# Patient Record
Sex: Female | Born: 1960 | Race: White | Hispanic: No | Marital: Married | Smoking: Never smoker
Health system: Southern US, Community
[De-identification: ages and names within clinical notes are randomized; demographics above are authoritative.]

## PROBLEM LIST (undated history)

## (undated) DIAGNOSIS — R197 Diarrhea, unspecified: Secondary | ICD-10-CM

## (undated) DIAGNOSIS — N809 Endometriosis, unspecified: Secondary | ICD-10-CM

## (undated) DIAGNOSIS — R067 Sneezing: Secondary | ICD-10-CM

## (undated) DIAGNOSIS — D649 Anemia, unspecified: Secondary | ICD-10-CM

## (undated) DIAGNOSIS — Z6838 Body mass index (BMI) 38.0-38.9, adult: Secondary | ICD-10-CM

## (undated) DIAGNOSIS — C801 Malignant (primary) neoplasm, unspecified: Secondary | ICD-10-CM

## (undated) DIAGNOSIS — E559 Vitamin D deficiency, unspecified: Secondary | ICD-10-CM

## (undated) DIAGNOSIS — R0789 Other chest pain: Secondary | ICD-10-CM

## (undated) DIAGNOSIS — T7840XA Allergy, unspecified, initial encounter: Secondary | ICD-10-CM

## (undated) DIAGNOSIS — R4182 Altered mental status, unspecified: Secondary | ICD-10-CM

## (undated) DIAGNOSIS — J45909 Unspecified asthma, uncomplicated: Secondary | ICD-10-CM

## (undated) DIAGNOSIS — E119 Type 2 diabetes mellitus without complications: Secondary | ICD-10-CM

## (undated) DIAGNOSIS — F32A Depression, unspecified: Secondary | ICD-10-CM

## (undated) DIAGNOSIS — K219 Gastro-esophageal reflux disease without esophagitis: Secondary | ICD-10-CM

## (undated) DIAGNOSIS — R002 Palpitations: Secondary | ICD-10-CM

## (undated) DIAGNOSIS — F419 Anxiety disorder, unspecified: Secondary | ICD-10-CM

## (undated) DIAGNOSIS — G4730 Sleep apnea, unspecified: Secondary | ICD-10-CM

## (undated) HISTORY — DX: Sleep apnea, unspecified: G47.30

## (undated) HISTORY — DX: Gastro-esophageal reflux disease without esophagitis: K21.9

## (undated) HISTORY — DX: Vitamin D deficiency, unspecified: E55.9

## (undated) HISTORY — DX: Allergy, unspecified, initial encounter: T78.40XA

## (undated) HISTORY — DX: Malignant (primary) neoplasm, unspecified: C80.1

## (undated) HISTORY — DX: Unspecified asthma, uncomplicated: J45.909

## (undated) HISTORY — DX: Anemia, unspecified: D64.9

## (undated) HISTORY — DX: Sneezing: R06.7

## (undated) HISTORY — DX: Palpitations: R00.2

## (undated) HISTORY — DX: Diarrhea, unspecified: R19.7

## (undated) HISTORY — DX: Other chest pain: R07.89

## (undated) HISTORY — DX: Altered mental status, unspecified: R41.82

## (undated) HISTORY — DX: Type 2 diabetes mellitus without complications: E11.9

## (undated) HISTORY — DX: Body mass index (BMI) 38.0-38.9, adult: Z68.38

## (undated) HISTORY — PX: ABDOMINAL HYSTERECTOMY: SHX81

## (undated) HISTORY — PX: ABDOMINAL SURGERY: SHX537

## (undated) HISTORY — DX: Anxiety disorder, unspecified: F41.9

## (undated) HISTORY — DX: Depression, unspecified: F32.A

---

## 2013-05-06 ENCOUNTER — Emergency Department (HOSPITAL_COMMUNITY): Payer: BC Managed Care – PPO

## 2013-05-06 ENCOUNTER — Emergency Department (HOSPITAL_COMMUNITY)
Admission: EM | Admit: 2013-05-06 | Discharge: 2013-05-06 | Disposition: A | Discharge status: Home / Self Care | Payer: BC Managed Care – PPO | Attending: Emergency Medicine | Admitting: Emergency Medicine

## 2013-05-06 ENCOUNTER — Encounter (HOSPITAL_COMMUNITY): Payer: Self-pay | Admitting: *Deleted

## 2013-05-06 DIAGNOSIS — Z8742 Personal history of other diseases of the female genital tract: Secondary | ICD-10-CM | POA: Insufficient documentation

## 2013-05-06 DIAGNOSIS — R1031 Right lower quadrant pain: Secondary | ICD-10-CM | POA: Insufficient documentation

## 2013-05-06 DIAGNOSIS — Z9889 Other specified postprocedural states: Secondary | ICD-10-CM | POA: Insufficient documentation

## 2013-05-06 DIAGNOSIS — Z79899 Other long term (current) drug therapy: Secondary | ICD-10-CM | POA: Insufficient documentation

## 2013-05-06 DIAGNOSIS — K5900 Constipation, unspecified: Secondary | ICD-10-CM | POA: Insufficient documentation

## 2013-05-06 DIAGNOSIS — R109 Unspecified abdominal pain: Secondary | ICD-10-CM

## 2013-05-06 HISTORY — DX: Endometriosis, unspecified: N80.9

## 2013-05-06 LAB — COMPREHENSIVE METABOLIC PANEL
ALT: 9 U/L (ref 0–35)
AST: 9 U/L (ref 0–37)
Albumin: 3.6 g/dL (ref 3.5–5.2)
Alkaline Phosphatase: 50 U/L (ref 39–117)
BUN: 7 mg/dL (ref 6–23)
CO2: 28 mEq/L (ref 19–32)
Calcium: 9.1 mg/dL (ref 8.4–10.5)
Chloride: 102 mEq/L (ref 96–112)
Creatinine, Ser: 0.67 mg/dL (ref 0.50–1.10)
GFR calc Af Amer: >90 mL/min (ref >90)
GFR calc non Af Amer: >90 mL/min (ref >90)
Glucose, Bld: 102 mg/dL — ABNORMAL HIGH (ref 70–99)
Potassium: 4.2 mEq/L (ref 3.5–5.1)
Sodium: 137 mEq/L (ref 135–145)
Total Bilirubin: 0.3 mg/dL (ref 0.3–1.2)
Total Protein: 6.6 g/dL (ref 6.0–8.3)

## 2013-05-06 LAB — URINALYSIS, ROUTINE W REFLEX MICROSCOPIC
Bilirubin Urine: NEGATIVE
Glucose, UA: NEGATIVE mg/dL
Hgb urine dipstick: NEGATIVE
Ketones, ur: NEGATIVE mg/dL
Leukocytes, UA: NEGATIVE
Nitrite: NEGATIVE
Protein, ur: NEGATIVE mg/dL
Specific Gravity, Urine: 1.01 (ref 1.005–1.030)
Urobilinogen, UA: 0.2 mg/dL (ref 0.0–1.0)
pH: 7 (ref 5.0–8.0)

## 2013-05-06 LAB — CBC
HCT: 39.1 % (ref 36.0–46.0)
Hemoglobin: 13.2 g/dL (ref 12.0–15.0)
MCH: 29.1 pg (ref 26.0–34.0)
MCHC: 33.8 g/dL (ref 30.0–36.0)
MCV: 86.3 fL (ref 78.0–100.0)
Platelets: 195 10*3/uL (ref 150–400)
RBC: 4.53 MIL/uL (ref 3.87–5.11)
RDW: 14.4 % (ref 11.5–15.5)
WBC: 5.4 10*3/uL (ref 4.0–10.5)

## 2013-05-06 MED ORDER — SODIUM CHLORIDE 0.9 % IV BOLUS (SEPSIS)
2000.0000 mL | Freq: Once | INTRAVENOUS | Status: AC
  Administered 2013-05-06: 2000 mL via INTRAVENOUS

## 2013-05-06 MED ORDER — IOHEXOL 300 MG/ML  SOLN
100.0000 mL | Freq: Once | INTRAMUSCULAR | Status: AC | PRN
  Administered 2013-05-06: 100 mL via INTRAVENOUS

## 2013-05-06 MED ORDER — IOHEXOL 300 MG/ML  SOLN
50.0000 mL | Freq: Once | INTRAMUSCULAR | Status: AC | PRN
  Administered 2013-05-06: 50 mL via ORAL

## 2013-05-06 NOTE — Progress Notes (Signed)
[facility] ED CM noted not pcp for pt  She reports her pcp is in [place] [address] Maida Sale at [phone] and also sees Roxanne Hollander at [phone]  EPIC updated

## 2013-05-06 NOTE — ED Notes (Signed)
Patient transported to CT 

## 2013-05-06 NOTE — ED Notes (Signed)
Patient transported to X-ray 

## 2013-05-06 NOTE — ED Notes (Signed)
Pt from home with reports of RLQ pain and decrease in size and amount of stool over the past 3 weeks to spite using enemas and stool softeners. Pt endorses hx of endometriosis that has wrapped around her intestine. Pt denies N/V and reports that she has been eating and drinking normally. Pt reports having US done 2 days ago due to pain and was told that if pain continues she needed to come to ED for further eval. Thayer Ohm, PA at bedside at 1154.

## 2013-05-10 NOTE — ED Provider Notes (Signed)
History     CSN: [id number]  Arrival date & time 05/06/13  1125   First MD Initiated Contact with Patient 05/06/13 1144      Chief Complaint  Patient presents with  . Constipation  . Abdominal Pain    RLQ    (Consider location/radiation/quality/duration/timing/severity/associated sxs/prior treatment) HPI The patient presents to the emergency department with constipation for the last week.  Patient, states she's had decreased bowel movement.  Patient, states she's also had decreased bowel movements over the last several years.  Patient denies chest pain, shortness breath, nausea, vomiting, diarrhea, weakness, dizziness, headache, blurred vision, fever, back pain, dysuria, bloody stool, vaginal bleeding, or syncope.  Patient, states, that she's tried over-the-counter holistic treatment for bowel movements.  Patient, states she feels, like there is decreased movement of her stool. Past Medical History  Diagnosis Date  . Endometriosis     Past Surgical History  Procedure Laterality Date  . Cesarean section    . Abdominal surgery      laporoscopic    History reviewed. No pertinent family history.  History  Substance Use Topics  . Smoking status: Never Smoker   . Smokeless tobacco: Never Used  . Alcohol Use: Yes     Comment: 5-7 times a month    OB History   Grav Para Term Preterm Abortions TAB SAB Ect Mult Living                  Review of Systems All other systems negative except as documented in the HPI. All pertinent positives and negatives as reviewed in the HPI.  Allergies  Codeine; Food; Gluten meal; Lactose intolerance (gi); Percocet; and Sudafed  Home Medications   Current Outpatient Rx  Name  Route  Sig  Dispense  Refill  . albuterol (PROVENTIL HFA;VENTOLIN HFA) 108 (90 BASE) MCG/ACT inhaler   Inhalation   Inhale 2 puffs into the lungs every 6 (six) hours as needed (for asthma).         . Cobalamine Combinations (METHYL-MAX PO)   Oral   Take 1  tablet by mouth daily.         . Coenzyme Q10 (COQ10 PO)   Oral   Take 1 capsule by mouth daily.         Marland Kitchen EPINEPHrine (EPIPEN) 0.3 mg/0.3 mL DEVI   Intramuscular   Inject 0.3 mg into the muscle as needed (for allergic reaction).         . fish oil-omega-3 fatty acids 1000 MG capsule   Oral   Take 1 g by mouth daily.         Marland Kitchen GLYCINE PO   Oral   Take 1 tablet by mouth daily.         . Lactobacillus (ACIDOPHILUS PO)   Oral   Take 1 capsule by mouth daily.         Marland Kitchen MAGNESIUM PO   Oral   Take 1 tablet by mouth daily. Magnesium taurate         . Misc Natural Products (COLON HERBAL CLEANSER PO)   Oral   Take 1 capsule by mouth as needed (for bowel movement).         Marland Kitchen OVER THE COUNTER MEDICATION   Oral   Take 1 capsule by mouth daily. Catazyme.         Marland Kitchen OVER THE COUNTER MEDICATION      Supplement Iron Plant food  BP 129/63  Pulse 66  Temp(Src) 98.4 F (36.9 C) (Oral)  Resp 18  SpO2 100%  LMP 04/22/2013  Physical Exam  Nursing note and vitals reviewed. Constitutional: She is oriented to person, place, and time. She appears well-developed and well-nourished. No distress.  HENT:  Head: Normocephalic and atraumatic.  Mouth/Throat: Oropharynx is clear and moist.  Eyes: Pupils are equal, round, and reactive to light.  Neck: Normal range of motion. Neck supple.  Cardiovascular: Normal rate, regular rhythm and normal heart sounds.  Exam reveals no gallop and no friction rub.   No murmur heard. Pulmonary/Chest: Effort normal and breath sounds normal. No respiratory distress.  Abdominal: Soft. Bowel sounds are normal. She exhibits no distension. There is tenderness. There is no rebound and no guarding.  Neurological: She is alert and oriented to person, place, and time. She exhibits normal muscle tone. Coordination normal.  Skin: Skin is warm and dry.    ED Course  Procedures (including critical care time)  Labs Reviewed   COMPREHENSIVE METABOLIC PANEL - Abnormal; Notable for the following:    Glucose, Bld 102 (*)    All other components within normal limits  CBC  URINALYSIS, ROUTINE W REFLEX MICROSCOPIC   Patient is given GI.  Followup.  All lab tests and results were discussed with the patient at great length.  All questions were answered with the, the patient and her husband.  Patient is advised return to the emergency department for any worsening in her condition, that this could be an evolving process that is yet to declare itself.  1. Abdominal discomfort       MDM  MDM Reviewed: vitals and nursing note Interpretation: labs, CT scan and x-ray           Carlyle Dolly, PA-C 05/10/13 1850

## 2013-05-15 NOTE — ED Provider Notes (Signed)
Medical screening examination/treatment/procedure(s) were performed by non-physician practitioner and as supervising physician I was immediately available for consultation/collaboration.   Suzi Roots, MD 05/15/13 1101

## 2013-08-29 DIAGNOSIS — J45909 Unspecified asthma, uncomplicated: Secondary | ICD-10-CM | POA: Insufficient documentation

## 2020-07-25 DIAGNOSIS — R197 Diarrhea, unspecified: Secondary | ICD-10-CM | POA: Insufficient documentation

## 2020-07-25 DIAGNOSIS — E559 Vitamin D deficiency, unspecified: Secondary | ICD-10-CM | POA: Insufficient documentation

## 2020-07-25 DIAGNOSIS — N809 Endometriosis, unspecified: Secondary | ICD-10-CM | POA: Insufficient documentation

## 2020-07-25 DIAGNOSIS — R0789 Other chest pain: Secondary | ICD-10-CM | POA: Insufficient documentation

## 2020-07-25 DIAGNOSIS — Z6838 Body mass index (BMI) 38.0-38.9, adult: Secondary | ICD-10-CM | POA: Insufficient documentation

## 2020-07-25 DIAGNOSIS — R002 Palpitations: Secondary | ICD-10-CM | POA: Insufficient documentation

## 2020-07-25 DIAGNOSIS — R067 Sneezing: Secondary | ICD-10-CM | POA: Insufficient documentation

## 2020-07-25 DIAGNOSIS — R4182 Altered mental status, unspecified: Secondary | ICD-10-CM | POA: Insufficient documentation

## 2020-07-26 ENCOUNTER — Other Ambulatory Visit: Payer: Self-pay

## 2020-07-26 ENCOUNTER — Ambulatory Visit: Payer: BC Managed Care – PPO | Admitting: Cardiology

## 2020-07-26 ENCOUNTER — Encounter: Payer: Self-pay | Admitting: Cardiology

## 2020-07-26 ENCOUNTER — Ambulatory Visit (INDEPENDENT_AMBULATORY_CARE_PROVIDER_SITE_OTHER): Payer: BC Managed Care – PPO

## 2020-07-26 VITALS — BP 148/98 | HR 68 | Ht 66.5 in | Wt 228.8 lb

## 2020-07-26 DIAGNOSIS — R002 Palpitations: Secondary | ICD-10-CM

## 2020-07-26 DIAGNOSIS — R030 Elevated blood-pressure reading, without diagnosis of hypertension: Secondary | ICD-10-CM

## 2020-07-26 DIAGNOSIS — R0602 Shortness of breath: Secondary | ICD-10-CM

## 2020-07-26 DIAGNOSIS — R072 Precordial pain: Secondary | ICD-10-CM

## 2020-07-26 DIAGNOSIS — E669 Obesity, unspecified: Secondary | ICD-10-CM

## 2020-07-26 DIAGNOSIS — R0789 Other chest pain: Secondary | ICD-10-CM

## 2020-07-26 MED ORDER — NITROGLYCERIN 0.4 MG SL SUBL: 0.4000 mg | SUBLINGUAL_TABLET | SUBLINGUAL | 3 refills | Status: DC | PRN

## 2020-07-26 MED ORDER — METOPROLOL TARTRATE 100 MG PO TABS: 100.0000 mg | ORAL_TABLET | ORAL | 0 refills | Status: DC

## 2020-07-26 NOTE — Progress Notes (Signed)
Cardiology Office Note:    Date:  07/26/2020   ID:  Kelly Good, DOB 1961/05/27, MRN [id number]  PCP:  System, Provider Not In  Cardiologist:  Berniece Salines, DO  Electrophysiologist:  None   Referring MD: Chriss Czar, MD   " I experiencing some chest tightness"  History of Present Illness:    Kelly Good is a 59 y.o. female with a hx of gestational diabetes, has had a full-term delivery of 2 of her kids, uterine cancer with full hysterectomy, obesity, patient was adopted therefore does not know other medical history.  The patient comes today to tell me that she has been experiencing intermittent chest tightness.  She notes that it was going on for a while when she had intermittent chest tightness but recently she has been having midsternal chest tightness that now radiates to her shoulder and down her arm.  At times she does have some shortness of breath.  This does improve with rest.  In addition she is concerned with the fact that she is also is having abrupt intermittent fast heartbeat which last for few minutes and then resolved.  The episodes are getting more more she therefore decided to see her PCP who recommended she see cardiology.    Past Medical History:  Diagnosis Date  . Altered mental status, unspecified   . BMI 38.0-38.9,adult   . Diarrhea, unspecified   . Endometriosis   . Other chest pain   . Palpitations   . Sneezing   . Vitamin D deficiency, unspecified     Past Surgical History:  Procedure Laterality Date  . ABDOMINAL SURGERY     laporoscopic  . CESAREAN SECTION      Current Medications: Current Meds  Medication Sig  . albuterol (PROVENTIL HFA;VENTOLIN HFA) 108 (90 BASE) MCG/ACT inhaler Inhale 2 puffs into the lungs every 6 (six) hours as needed (for asthma).  . EPINEPHrine (EPIPEN) 0.3 mg/0.3 mL DEVI Inject 0.3 mg into the muscle as needed (for allergic reaction).  . Lactobacillus (ACIDOPHILUS PO) Take 1 capsule by mouth daily.  Marland Kitchen MAGNESIUM PO  Take 1 tablet by mouth daily. Magnesium taurate  . Misc Natural Products (COLON HERBAL CLEANSER PO) Take 1 capsule by mouth as needed (for bowel movement).  Marland Kitchen OVER THE COUNTER MEDICATION Take 1 capsule by mouth daily. Catazyme.     Allergies:   Codeine, Doxycycline, Pseudoephedrine hcl, Latex, Other, Tomato, Food, Lactose intolerance (gi), Percocet [oxycodone-acetaminophen], Casein, Gluten meal, Lactase, and Sudafed [pseudoephedrine hcl]   Social History   Socioeconomic History  . Marital status: Married    Spouse name: Not on file  . Number of children: Not on file  . Years of education: Not on file  . Highest education level: Not on file  Occupational History  . Not on file  Tobacco Use  . Smoking status: Never Smoker  . Smokeless tobacco: Never Used  Substance and Sexual Activity  . Alcohol use: Yes    Comment: 5-7 times a month  . Drug use: No  . Sexual activity: Not on file  Other Topics Concern  . Not on file  Social History Narrative  . Not on file   Social Determinants of Health   Financial Resource Strain:   . Difficulty of Paying Living Expenses: Not on file  Food Insecurity:   . Worried About Charity fundraiser in the Last Year: Not on file  . Ran Out of Food in the Last Year: Not on file  Transportation Needs:   .  Lack of Transportation (Medical): Not on file  . Lack of Transportation (Non-Medical): Not on file  Physical Activity:   . Days of Exercise per Week: Not on file  . Minutes of Exercise per Session: Not on file  Stress:   . Feeling of Stress : Not on file  Social Connections:   . Frequency of Communication with Friends and Family: Not on file  . Frequency of Social Gatherings with Friends and Family: Not on file  . Attends Religious Services: Not on file  . Active Member of Clubs or Organizations: Not on file  . Attends Banker Meetings: Not on file  . Marital Status: Not on file     Family History: The patient's family history  is not on file. She was adopted.  ROS:   Review of Systems  Constitution: Negative for decreased appetite, fever and weight gain.  HENT: Negative for congestion, ear discharge, hoarse voice and sore throat.   Eyes: Negative for discharge, redness, vision loss in right eye and visual halos.  Cardiovascular: Reports chest pain, dyspnea on exertion and palpitations.  Negative for leg swelling, orthopnea.  Respiratory: Negative for cough, hemoptysis, shortness of breath and snoring.   Endocrine: Negative for heat intolerance and polyphagia.  Hematologic/Lymphatic: Negative for bleeding problem. Does not bruise/bleed easily.  Skin: Negative for flushing, nail changes, rash and suspicious lesions.  Musculoskeletal: Negative for arthritis, joint pain, muscle cramps, myalgias, neck pain and stiffness.  Gastrointestinal: Negative for abdominal pain, bowel incontinence, diarrhea and excessive appetite.  Genitourinary: Negative for decreased libido, genital sores and incomplete emptying.  Neurological: Negative for brief paralysis, focal weakness, headaches and loss of balance.  Psychiatric/Behavioral: Negative for altered mental status, depression and suicidal ideas.  Allergic/Immunologic: Negative for HIV exposure and persistent infections.    EKGs/Labs/Other Studies Reviewed:    The following studies were reviewed today:   EKG:  The ekg ordered today demonstrates sinus rhythm, heart rate 60 bpm nonspecific ST changes.  Recent Labs: WBC 5.6, hemoglobin 14.0, hematocrit 40.5, platelets 228 Alk phos 64, AST 8, ALT 11 TSH 1.61  Recent Lipid Panel No results found for: CHOL, TRIG, HDL, CHOLHDL, VLDL, LDLCALC, LDLDIRECT  Total cholesterol 172, triglyceride 63, HDL 69, LDL 91  Physical Exam:    VS:  BP (!) 148/98   Pulse 68   Ht 5' 6.5" (1.689 m)   Wt 228 lb 12.8 oz (103.8 kg)   SpO2 95%   BMI 36.38 kg/m     Wt Readings from Last 3 Encounters:  07/26/20 228 lb 12.8 oz (103.8 kg)      GEN: Well nourished, well developed in no acute distress HEENT: Normal NECK: No JVD; No carotid bruits LYMPHATICS: No lymphadenopathy CARDIAC: S1S2 noted,RRR, no murmurs, rubs, gallops RESPIRATORY:  Clear to auscultation without rales, wheezing or rhonchi  ABDOMEN: Soft, non-tender, non-distended, +bowel sounds, no guarding. EXTREMITIES: No edema, No cyanosis, no clubbing MUSCULOSKELETAL:  No deformity  SKIN: Warm and dry NEUROLOGIC:  Alert and oriented x 3, non-focal PSYCHIATRIC:  Normal affect, good insight  ASSESSMENT:    1. Palpitations   2. Precordial pain   3. Elevated blood pressure reading   4. Shortness of breath   5. Obesity (BMI 30-39.9)   6. Other chest pain    PLAN:    I would like to rule out a cardiovascular etiology of this palpitation, therefore at this time I would like to placed a zio patch for 7 days. In additon a transthoracic echocardiogram will  be ordered to assess LV/RV function and any structural abnormalities. Once these testing have been performed amd reviewed further reccomendations will be made. For now, I do reccomend that the patient goes to the nearest ED if  symptoms recur.  In addition her chest pain is concerning unfortunately outside of gestational diabetes and obesity we have no other known risk factors for family history given the patient was adopted.  Therefore like to proceed with ischemic evaluation.  A coronary CTA would be appropriate in this patient.  She has no IV contrast dye allergy she is agreeable to proceed with testing.  Sublingual nitroglycerin prescription was sent, its protocol and 911 protocol explained and the patient vocalized understanding questions were answered to the patient's satisfaction  Her blood pressure manually is elevated in the office such discussed with the patient she tells me that she does not have a history of hypertension at this advised her that she may be developing hypertension.  She notes that she  discussed with her PCP prior and she has been taking her blood pressure in the last couple which she tells me that she is taking it at times she gets a systolic of 737 time to get a systolic of 106.  I have asked the patient to take her blood pressure steady twice daily for also be able to review this and make good recommendations.  The patient understands the need to lose weight with diet and exercise. We have discussed specific strategies for this.  The patient is in agreement with the above plan. The patient left the office in stable condition.  The patient will follow up in 3 months or sooner if needed.   Medication Adjustments/Labs and Tests Ordered: Current medicines are reviewed at length with the patient today.  Concerns regarding medicines are outlined above.  Orders Placed This Encounter  Procedures  . CT CORONARY MORPH W/CTA COR W/SCORE W/CA W/CM &/OR WO/CM  . CT CORONARY FRACTIONAL FLOW RESERVE DATA PREP  . CT CORONARY FRACTIONAL FLOW RESERVE FLUID ANALYSIS  . Basic metabolic panel  . LONG TERM MONITOR (3-14 DAYS)  . EKG 12-Lead  . ECHOCARDIOGRAM COMPLETE   Meds ordered this encounter  Medications  . nitroGLYCERIN (NITROSTAT) 0.4 MG SL tablet    Sig: Place 1 tablet (0.4 mg total) under the tongue every 5 (five) minutes as needed for chest pain.    Dispense:  25 tablet    Refill:  3  . metoprolol tartrate (LOPRESSOR) 100 MG tablet    Sig: Take 1 tablet (100 mg total) by mouth as directed. Take 2 hours prior to CT    Dispense:  1 tablet    Refill:  0    Patient Instructions  Medication Instructions:  Start Nitroglycerin 0.4 mg every 5 minutes as needed for chest pain   *If you need a refill on your cardiac medications before your next appointment, please call your pharmacy*   Lab Work: Your physician recommends that you return for lab work in 1 week before CT Lab is open Monday-Friday 8 am- 5 pm, No appointment needed  If you have labs (blood work) drawn today and  your tests are completely normal, you will receive your results only by: Marland Kitchen MyChart Message (if you have MyChart) OR . A paper copy in the mail If you have any lab test that is abnormal or we need to change your treatment, we will call you to review the results.   Testing/Procedures: A zio monitor was ordered today.  It will remain on for 7 days. You will then return monitor and event diary in provided box. It takes 1-2 weeks for report to be downloaded and returned to [place]. We will call you with the results. If monitor falls off or has orange flashing light, please call Zio for further instructions.   Your physician has requested that you have an echocardiogram. Echocardiography is a painless test that uses sound waves to create images of your heart. It provides your doctor with information about the size and shape of your heart and how well your heart's chambers and valves are working. This procedure takes approximately one hour. There are no restrictions for this procedure.  Your physician has ordered for you to have a cardiac Ct *Instructions below*     Follow-Up: At [facility], you and your health needs are our priority.  As part of our continuing mission to provide you with exceptional heart care, we have created designated Provider Care Teams.  These Care Teams include your primary Cardiologist (physician) and Advanced Practice Providers (APPs -  Physician Assistants and Nurse Practitioners) who all work together to provide you with the care you need, when you need it.  We recommend signing up for the patient portal called "MyChart".  Sign up information is provided on this After Visit Summary.  MyChart is used to connect with patients for Virtual Visits (Telemedicine).  Patients are able to view lab/test results, encounter notes, upcoming appointments, etc.  Non-urgent messages can be sent to your provider as well.   To learn more about what you can do with MyChart, go to  [email or web address].    Your next appointment:   3 month(s)  The format for your next appointment:   In Person  Provider:   Berniece Salines, DO   Other Instructions Your cardiac CT will be scheduled at one of the below locations:   [facility] [address] [place], [place] [address] [phone]   If scheduled at [facility], please arrive at the [facility] main entrance of [facility] 30 minutes prior to test start time. Proceed to the [facility] Radiology Department (first floor) to check-in and test prep.  Please follow these instructions carefully (unless otherwise directed):  On the Night Before the Test: . Be sure to Drink plenty of water. . Do not consume any caffeinated/decaffeinated beverages or chocolate 12 hours prior to your test. . Do not take any antihistamines 12 hours prior to your test.  On the Day of the Test: . Drink plenty of water. Do not drink any water within one hour of the test. . Do not eat any food 4 hours prior to the test. . You may take your regular medications prior to the test.  . Take metoprolol (Lopressor) two hours prior to test. . FEMALES- please wear underwire-free bra if available        After the Test: . Drink plenty of water. . After receiving IV contrast, you may experience a mild flushed feeling. This is normal. . On occasion, you may experience a mild rash up to 24 hours after the test. This is not dangerous. If this occurs, you can take Benadryl 25 mg and increase your fluid intake. . If you experience trouble breathing, this can be serious. If it is severe call 911 IMMEDIATELY. If it is mild, please call our office. . If you take any of these medications: Glipizide/Metformin, Avandament, Glucavance, please do not take 48 hours after completing  test unless otherwise instructed.   Once we have confirmed authorization from your insurance company, we will call you to set up a date and time for  your test. Based on how quickly your insurance processes prior authorizations requests, please allow up to 4 weeks to be contacted for scheduling your Cardiac CT appointment. Be advised that routine Cardiac CT appointments could be scheduled as many as 8 weeks after your provider has ordered it.  For non-scheduling related questions, please contact the cardiac imaging nurse navigator should you have any questions/concerns: Marchia Bond, Cardiac Imaging Nurse Navigator Burley Saver, Interim Cardiac Imaging Nurse [facility] Direct Office Dial: [phone]   For scheduling needs, including cancellations and rescheduling, please call Vivien Rota at [phone], option 3.        Adopting a Healthy Lifestyle.  Know what a healthy weight is for you (roughly BMI <25) and aim to maintain this   Aim for 7+ servings of fruits and vegetables daily   65-80+ fluid ounces of water or unsweet tea for healthy kidneys   Limit to max 1 drink of alcohol per day; avoid smoking/tobacco   Limit animal fats in diet for cholesterol and heart health - choose grass fed whenever available   Avoid highly processed foods, and foods high in saturated/trans fats   Aim for low stress - take time to unwind and care for your mental health   Aim for 150 min of moderate intensity exercise weekly for heart health, and weights twice weekly for bone health   Aim for 7-9 hours of sleep daily   When it comes to diets, agreement about the perfect plan isnt easy to find, even among the experts. Experts at the [employer] developed an idea known as the Healthy Eating Plate.  Yes when she is yes yes or is body program so explained to me in the office imagine a plate divided into logical, healthy portions.   The emphasis is on diet quality:   Load up on vegetables and fruits - one-half of your plate: Aim for color and variety, and remember that potatoes dont count.   Go  for whole grains - one-quarter of your plate: Whole wheat, barley, wheat berries, quinoa, oats, brown rice, and foods made with them. If you want pasta, go with whole wheat pasta.   Protein power - one-quarter of your plate: Fish, chicken, beans, and nuts are all healthy, versatile protein sources. Limit red meat.   The diet, however, does go beyond the plate, offering a few other suggestions.   Use healthy plant oils, such as olive, canola, soy, corn, sunflower and peanut. Check the labels, and avoid partially hydrogenated oil, which have unhealthy trans fats.   If youre thirsty, drink water. Coffee and tea are good in moderation, but skip sugary drinks and limit milk and dairy products to one or two daily servings.   The type of carbohydrate in the diet is more important than the amount. Some sources of carbohydrates, such as vegetables, fruits, whole grains, and beans-are healthier than others.   Finally, stay active  Signed, Berniece Salines, DO  07/26/2020 12:01 PM    [facility]

## 2020-07-26 NOTE — Patient Instructions (Addendum)
Medication Instructions:  Start Nitroglycerin 0.4 mg every 5 minutes as needed for chest pain   *If you need a refill on your cardiac medications before your next appointment, please call your pharmacy*   Lab Work: Your physician recommends that you return for lab work in 1 week before CT Lab is open Monday-Friday 8 am- 5 pm, No appointment needed  If you have labs (blood work) drawn today and your tests are completely normal, you will receive your results only by: Marland Kitchen MyChart Message (if you have MyChart) OR . A paper copy in the mail If you have any lab test that is abnormal or we need to change your treatment, we will call you to review the results.   Testing/Procedures: A zio monitor was ordered today. It will remain on for 7 days. You will then return monitor and event diary in provided box. It takes 1-2 weeks for report to be downloaded and returned to [place]. We will call you with the results. If monitor falls off or has orange flashing light, please call Zio for further instructions.   Your physician has requested that you have an echocardiogram. Echocardiography is a painless test that uses sound waves to create images of your heart. It provides your doctor with information about the size and shape of your heart and how well your heart's chambers and valves are working. This procedure takes approximately one hour. There are no restrictions for this procedure.  Your physician has ordered for you to have a cardiac Ct *Instructions below*     Follow-Up: At [facility], you and your health needs are our priority.  As part of our continuing mission to provide you with exceptional heart care, we have created designated Provider Care Teams.  These Care Teams include your primary Cardiologist (physician) and Advanced Practice Providers (APPs -  Physician Assistants and Nurse Practitioners) who all work together to provide you with the care you need, when you need it.  We recommend signing  up for the patient portal called "MyChart".  Sign up information is provided on this After Visit Summary.  MyChart is used to connect with patients for Virtual Visits (Telemedicine).  Patients are able to view lab/test results, encounter notes, upcoming appointments, etc.  Non-urgent messages can be sent to your provider as well.   To learn more about what you can do with MyChart, go to [email or web address].    Your next appointment:   3 month(s)  The format for your next appointment:   In Person  Provider:   Berniece Salines, DO   Other Instructions Your cardiac CT will be scheduled at one of the below locations:   [facility] [address] [place], [place] [address] [phone]   If scheduled at [facility], please arrive at the [facility] main entrance of [facility] 30 minutes prior to test start time. Proceed to the [facility] Radiology Department (first floor) to check-in and test prep.  Please follow these instructions carefully (unless otherwise directed):  On the Night Before the Test: . Be sure to Drink plenty of water. . Do not consume any caffeinated/decaffeinated beverages or chocolate 12 hours prior to your test. . Do not take any antihistamines 12 hours prior to your test.  On the Day of the Test: . Drink plenty of water. Do not drink any water within one hour of the test. . Do not eat any food 4 hours prior to the test. . You may take  your regular medications prior to the test.  . Take metoprolol (Lopressor) two hours prior to test. . FEMALES- please wear underwire-free bra if available        After the Test: . Drink plenty of water. . After receiving IV contrast, you may experience a mild flushed feeling. This is normal. . On occasion, you may experience a mild rash up to 24 hours after the test. This is not dangerous. If this occurs, you can take Benadryl 25 mg and increase your fluid intake. . If you experience trouble  breathing, this can be serious. If it is severe call 911 IMMEDIATELY. If it is mild, please call our office. . If you take any of these medications: Glipizide/Metformin, Avandament, Glucavance, please do not take 48 hours after completing test unless otherwise instructed.   Once we have confirmed authorization from your insurance company, we will call you to set up a date and time for your test. Based on how quickly your insurance processes prior authorizations requests, please allow up to 4 weeks to be contacted for scheduling your Cardiac CT appointment. Be advised that routine Cardiac CT appointments could be scheduled as many as 8 weeks after your provider has ordered it.  For non-scheduling related questions, please contact the cardiac imaging nurse navigator should you have any questions/concerns: Marchia Bond, Cardiac Imaging Nurse Navigator Burley Saver, Interim Cardiac Imaging Nurse [place] [facility] Direct Office Dial: [phone]   For scheduling needs, including cancellations and rescheduling, please call Vivien Rota at [phone], option 3.

## 2020-08-21 ENCOUNTER — Ambulatory Visit (INDEPENDENT_AMBULATORY_CARE_PROVIDER_SITE_OTHER): Payer: BC Managed Care – PPO

## 2020-08-21 ENCOUNTER — Other Ambulatory Visit: Payer: Self-pay

## 2020-08-21 DIAGNOSIS — R002 Palpitations: Secondary | ICD-10-CM | POA: Diagnosis not present

## 2020-08-21 DIAGNOSIS — R0602 Shortness of breath: Secondary | ICD-10-CM | POA: Diagnosis not present

## 2020-08-21 LAB — ECHOCARDIOGRAM COMPLETE
Area-P 1/2: 3.17 cm2
S' Lateral: 2.7 cm

## 2020-08-21 NOTE — Progress Notes (Signed)
Complete echocardiogram performed.  Jimmy Hiroki Wint RDCS, RVT  

## 2020-08-23 ENCOUNTER — Telehealth: Payer: Self-pay

## 2020-08-23 MED ORDER — METOPROLOL SUCCINATE ER 25 MG PO TB24: 12.5000 mg | ORAL_TABLET | Freq: Every day | ORAL | 3 refills | Status: DC

## 2020-08-23 NOTE — Telephone Encounter (Signed)
Spoke with patient regarding results and recommendation.  Patient verbalizes understanding and is agreeable to plan of care. Advised patient to call back with any issues or concerns.  

## 2020-08-23 NOTE — Telephone Encounter (Signed)
-----   Message from Berniece Salines, DO sent at 08/22/2020  6:14 PM EDT ----- You had few beats that came from the top of the heart that went as high as 193 bpm.  I like to start you on low-dose beta-blocker metoprolol 12.5 daily if you are still experiencing palpitations.

## 2020-08-29 ENCOUNTER — Telehealth (HOSPITAL_COMMUNITY): Payer: Self-pay | Admitting: *Deleted

## 2020-08-29 NOTE — Telephone Encounter (Signed)
Reaching out to patient to offer assistance regarding upcoming cardiac imaging study; pt verbalizes understanding of appt date/time, parking situation and where to check in, pre-test NPO status and medications ordered, and verified current allergies; name and call back number provided for further questions should they arise ° °Kashius Dominic Tai RN Navigator Cardiac Imaging °[facility] °[phone] office °[phone] cell ° °

## 2020-08-30 ENCOUNTER — Other Ambulatory Visit: Payer: Self-pay

## 2020-08-30 ENCOUNTER — Ambulatory Visit (HOSPITAL_COMMUNITY)
Admission: RE | Admit: 2020-08-30 | Discharge: 2020-08-30 | Disposition: A | Discharge status: Home / Self Care | Payer: BC Managed Care – PPO | Source: Ambulatory Visit | Attending: Cardiology | Admitting: Cardiology

## 2020-08-30 DIAGNOSIS — R0789 Other chest pain: Secondary | ICD-10-CM | POA: Diagnosis not present

## 2020-08-30 MED ORDER — NITROGLYCERIN 0.4 MG SL SUBL
SUBLINGUAL_TABLET | SUBLINGUAL | Status: AC
  Filled 2020-08-30: qty 2

## 2020-08-30 MED ORDER — NITROGLYCERIN 0.4 MG SL SUBL
0.8000 mg | SUBLINGUAL_TABLET | Freq: Once | SUBLINGUAL | Status: AC
  Administered 2020-08-30: 0.8 mg via SUBLINGUAL

## 2020-08-30 MED ORDER — IOHEXOL 350 MG/ML SOLN
80.0000 mL | Freq: Once | INTRAVENOUS | Status: AC | PRN
  Administered 2020-08-30: 80 mL via INTRAVENOUS

## 2020-08-31 ENCOUNTER — Telehealth: Payer: Self-pay

## 2020-08-31 NOTE — Telephone Encounter (Signed)
Spoke with patient regarding results and recommendation.  Patient verbalizes understanding and is agreeable to plan of care. Advised patient to call back with any issues or concerns.  

## 2020-08-31 NOTE — Telephone Encounter (Signed)
-----   Message from Berniece Salines, DO sent at 08/30/2020  7:09 PM EDT ----- Your cardiac CT does not show any evidence of coronary calcium and there are no blockages in your heart.  The noncardiac portions show evidence of small hiatal hernia.  For the small hiatal hernia I do suggest you discuss with your PCP if you are having lots of heartburns it would be beneficial to see a gastroenterologist.

## 2020-09-25 ENCOUNTER — Ambulatory Visit: Payer: BC Managed Care – PPO | Admitting: Cardiology

## 2020-10-25 ENCOUNTER — Ambulatory Visit: Payer: BC Managed Care – PPO | Admitting: Cardiology

## 2020-10-25 ENCOUNTER — Other Ambulatory Visit: Payer: Self-pay

## 2020-10-25 ENCOUNTER — Encounter: Payer: Self-pay | Admitting: Cardiology

## 2020-10-25 VITALS — BP 126/86 | HR 75 | Ht 66.0 in | Wt 228.0 lb

## 2020-10-25 DIAGNOSIS — I471 Supraventricular tachycardia: Secondary | ICD-10-CM

## 2020-10-25 DIAGNOSIS — E669 Obesity, unspecified: Secondary | ICD-10-CM

## 2020-10-25 NOTE — Progress Notes (Signed)
Cardiology Office Note:    Date:  10/25/2020   ID:  Kelly Good, DOB February 03, 1961, MRN [id number]  PCP:  Randel Books, FNP  Cardiologist:  Berniece Salines, DO  Electrophysiologist:  None   Referring MD: No ref. provider found   " I am doing well"  History of Present Illness:    Kelly Good is a 59 y.o. female with a hx of vitamin D deficiency, endometriosis is here today for follow-up visit.  Did see the patient back in September 2021 at that time due to her symptoms we order multiple testing including Zio patch, echocardiogram and a coronary CTA.  In the interim she was able to get this testing done.  And her results have been previously called out to her. She is here today for follow-up visit.  We started on metoprolol due to paroxysmal atrial tachycardia but she had not started this medication as she wanted to talk more about this. She has no symptoms.  Past Medical History:  Diagnosis Date  . Altered mental status, unspecified   . BMI 38.0-38.9,adult   . Diarrhea, unspecified   . Endometriosis   . Other chest pain   . Palpitations   . Sneezing   . Vitamin D deficiency, unspecified     Past Surgical History:  Procedure Laterality Date  . ABDOMINAL SURGERY     laporoscopic  . CESAREAN SECTION      Current Medications: Current Meds  Medication Sig  . albuterol (PROVENTIL HFA;VENTOLIN HFA) 108 (90 BASE) MCG/ACT inhaler Inhale 2 puffs into the lungs every 6 (six) hours as needed (for asthma).  . EPINEPHrine (EPIPEN) 0.3 mg/0.3 mL DEVI Inject 0.3 mg into the muscle as needed (for allergic reaction).  . Lactobacillus (ACIDOPHILUS PO) Take 1 capsule by mouth daily.  Marland Kitchen MAGNESIUM PO Take 1 tablet by mouth daily. Magnesium taurate  . metoprolol succinate (TOPROL-XL) 25 MG 24 hr tablet Take 0.5 tablets (12.5 mg total) by mouth daily.  . metoprolol tartrate (LOPRESSOR) 100 MG tablet Take 1 tablet (100 mg total) by mouth as directed. Take 2 hours prior to CT  . Misc Natural  Products (COLON HERBAL CLEANSER PO) Take 1 capsule by mouth as needed (for bowel movement).  . nitroGLYCERIN (NITROSTAT) 0.4 MG SL tablet Place 1 tablet (0.4 mg total) under the tongue every 5 (five) minutes as needed for chest pain.  Marland Kitchen OVER THE COUNTER MEDICATION Take 1 capsule by mouth daily. Catazyme.     Allergies:   Codeine, Doxycycline, Pseudoephedrine hcl, Latex, Other, Tomato, Food, Casein, Gluten meal, Lactase, Lactose intolerance (gi), Percocet [oxycodone-acetaminophen], and Sudafed [pseudoephedrine hcl]   Social History   Socioeconomic History  . Marital status: Married    Spouse name: Not on file  . Number of children: Not on file  . Years of education: Not on file  . Highest education level: Not on file  Occupational History  . Not on file  Tobacco Use  . Smoking status: Never Smoker  . Smokeless tobacco: Never Used  Substance and Sexual Activity  . Alcohol use: Yes    Comment: 5-7 times a month  . Drug use: No  . Sexual activity: Not on file  Other Topics Concern  . Not on file  Social History Narrative  . Not on file   Social Determinants of Health   Financial Resource Strain:   . Difficulty of Paying Living Expenses: Not on file  Food Insecurity:   . Worried About Charity fundraiser in the  Last Year: Not on file  . Ran Out of Food in the Last Year: Not on file  Transportation Needs:   . Lack of Transportation (Medical): Not on file  . Lack of Transportation (Non-Medical): Not on file  Physical Activity:   . Days of Exercise per Week: Not on file  . Minutes of Exercise per Session: Not on file  Stress:   . Feeling of Stress : Not on file  Social Connections:   . Frequency of Communication with Friends and Family: Not on file  . Frequency of Social Gatherings with Friends and Family: Not on file  . Attends Religious Services: Not on file  . Active Member of Clubs or Organizations: Not on file  . Attends Archivist Meetings: Not on file  .  Marital Status: Not on file     Family History: The patient's family history is not on file. She was adopted.  ROS:   Review of Systems  Constitution: Negative for decreased appetite, fever and weight gain.  HENT: Negative for congestion, ear discharge, hoarse voice and sore throat.   Eyes: Negative for discharge, redness, vision loss in right eye and visual halos.  Cardiovascular: Negative for chest pain, dyspnea on exertion, leg swelling, orthopnea and palpitations.  Respiratory: Negative for cough, hemoptysis, shortness of breath and snoring.   Endocrine: Negative for heat intolerance and polyphagia.  Hematologic/Lymphatic: Negative for bleeding problem. Does not bruise/bleed easily.  Skin: Negative for flushing, nail changes, rash and suspicious lesions.  Musculoskeletal: Negative for arthritis, joint pain, muscle cramps, myalgias, neck pain and stiffness.  Gastrointestinal: Negative for abdominal pain, bowel incontinence, diarrhea and excessive appetite.  Genitourinary: Negative for decreased libido, genital sores and incomplete emptying.  Neurological: Negative for brief paralysis, focal weakness, headaches and loss of balance.  Psychiatric/Behavioral: Negative for altered mental status, depression and suicidal ideas.  Allergic/Immunologic: Negative for HIV exposure and persistent infections.    EKGs/Labs/Other Studies Reviewed:    The following studies were reviewed today:   EKG:  The ekg ordered today demonstrates   Recent Labs: No results found for requested labs within last 8760 hours.  Recent Lipid Panel No results found for: CHOL, TRIG, HDL, CHOLHDL, VLDL, LDLCALC, LDLDIRECT  Physical Exam:    VS:  BP 126/86   Pulse 75   Ht 5\' 6"  (1.676 m)   Wt 228 lb (103.4 kg)   SpO2 98%   BMI 36.80 kg/m     Wt Readings from Last 3 Encounters:  10/25/20 228 lb (103.4 kg)  07/26/20 228 lb 12.8 oz (103.8 kg)     GEN: Well nourished, well developed in no acute  distress HEENT: Normal NECK: No JVD; No carotid bruits LYMPHATICS: No lymphadenopathy CARDIAC: S1S2 noted,RRR, no murmurs, rubs, gallops RESPIRATORY:  Clear to auscultation without rales, wheezing or rhonchi  ABDOMEN: Soft, non-tender, non-distended, +bowel sounds, no guarding. EXTREMITIES: No edema, No cyanosis, no clubbing MUSCULOSKELETAL:  No deformity  SKIN: Warm and dry NEUROLOGIC:  Alert and oriented x 3, non-focal PSYCHIATRIC:  Normal affect, good insight  ASSESSMENT:    1. PAT (paroxysmal atrial tachycardia) (HCC)   2. Obesity (BMI 30-39.9)    PLAN:     We discussed her testing report again.  She is not experiencing any symptoms.  But she will have to metoprolol as needed if she needed for her palpitations and atrial tachycardia. No further ischemic evaluation needs to be done.  We will continue to monitor. Her blood pressure has improved significantly. The  patient understands the need to lose weight with diet and exercise. We have discussed specific strategies for this.  The patient is in agreement with the above plan. The patient left the office in stable condition.  The patient will follow up in 12 months or sooner if needed.   Medication Adjustments/Labs and Tests Ordered: Current medicines are reviewed at length with the patient today.  Concerns regarding medicines are outlined above.  No orders of the defined types were placed in this encounter.  No orders of the defined types were placed in this encounter.   Patient Instructions  Medication Instructions:  Your physician recommends that you continue on your current medications as directed. Please refer to the Current Medication list given to you today.   *If you need a refill on your cardiac medications before your next appointment, please call your pharmacy*   Lab Work: none If you have labs (blood work) drawn today and your tests are completely normal, you will receive your results only by: Marland Kitchen MyChart  Message (if you have MyChart) OR . A paper copy in the mail If you have any lab test that is abnormal or we need to change your treatment, we will call you to review the results.   Testing/Procedures: none   Follow-Up: At [facility], you and your health needs are our priority.  As part of our continuing mission to provide you with exceptional heart care, we have created designated Provider Care Teams.  These Care Teams include your primary Cardiologist (physician) and Advanced Practice Providers (APPs -  Physician Assistants and Nurse Practitioners) who all work together to provide you with the care you need, when you need it.  We recommend signing up for the patient portal called "MyChart".  Sign up information is provided on this After Visit Summary.  MyChart is used to connect with patients for Virtual Visits (Telemedicine).  Patients are able to view lab/test results, encounter notes, upcoming appointments, etc.  Non-urgent messages can be sent to your provider as well.   To learn more about what you can do with MyChart, go to [email or web address].    Your next appointment:   12 month(s)  The format for your next appointment:   In Person  Provider:   Dr. Harriet Masson    Other Instructions     Adopting a Healthy Lifestyle.  Know what a healthy weight is for you (roughly BMI <25) and aim to maintain this   Aim for 7+ servings of fruits and vegetables daily   65-80+ fluid ounces of water or unsweet tea for healthy kidneys   Limit to max 1 drink of alcohol per day; avoid smoking/tobacco   Limit animal fats in diet for cholesterol and heart health - choose grass fed whenever available   Avoid highly processed foods, and foods high in saturated/trans fats   Aim for low stress - take time to unwind and care for your mental health   Aim for 150 min of moderate intensity exercise weekly for heart health, and weights twice weekly for bone health   Aim for 7-9 hours of sleep  daily   When it comes to diets, agreement about the perfect plan isnt easy to find, even among the experts. Experts at the [employer] developed an idea known as the Healthy Eating Plate. Just imagine a plate divided into logical, healthy portions.   The emphasis is on diet quality:   Load up on vegetables and fruits - one-half of your  plate: Aim for color and variety, and remember that potatoes dont count.   Go for whole grains - one-quarter of your plate: Whole wheat, barley, wheat berries, quinoa, oats, brown rice, and foods made with them. If you want pasta, go with whole wheat pasta.   Protein power - one-quarter of your plate: Fish, chicken, beans, and nuts are all healthy, versatile protein sources. Limit red meat.   The diet, however, does go beyond the plate, offering a few other suggestions.   Use healthy plant oils, such as olive, canola, soy, corn, sunflower and peanut. Check the labels, and avoid partially hydrogenated oil, which have unhealthy trans fats.   If youre thirsty, drink water. Coffee and tea are good in moderation, but skip sugary drinks and limit milk and dairy products to one or two daily servings.   The type of carbohydrate in the diet is more important than the amount. Some sources of carbohydrates, such as vegetables, fruits, whole grains, and beans-are healthier than others.   Finally, stay active   Follow-Up: At [facility], you and your health needs are our priority.  As part of our continuing mission to provide you with exceptional heart care, we have created designated Provider Care Teams.  These Care Teams include your primary Cardiologist (physician) and Advanced Practice Providers (APPs -  Physician Assistants and Nurse Practitioners) who all work together to provide you with the care you need, when you need it.   Rolly Pancake, DO  10/25/2020 10:56 AM    [place] with patients for  Virtual Visits (Telemedicine).  Patients are able to view lab/test results, encounter notes, upcoming appointments, etc.  Non-urgent messages can be sent to your provider as

## 2020-10-25 NOTE — Patient Instructions (Addendum)
Medication Instructions:  Your physician recommends that you continue on your current medications as directed. Please refer to the Current Medication list given to you today.   *If you need a refill on your cardiac medications before your next appointment, please call your pharmacy*   Lab Work: none If you have labs (blood work) drawn today and your tests are completely normal, you will receive your results only by: Marland Kitchen MyChart Message (if you have MyChart) OR . A paper copy in the mail If you have any lab test that is abnormal or we need to change your treatment, we will call you to review the results.   Testing/Procedures: none   Follow-Up: At [facility], you and your health needs are our priority.  As part of our continuing mission to provide you with exceptional heart care, we have created designated Provider Care Teams.  These Care Teams include your primary Cardiologist (physician) and Advanced Practice Providers (APPs -  Physician Assistants and Nurse Practitioners) who all work together to provide you with the care you need, when you need it.  We recommend signing up for the patient portal called "MyChart".  Sign up information is provided on this After Visit Summary.  MyChart is used to connect with patients for Virtual Visits (Telemedicine).  Patients are able to view lab/test results, encounter notes, upcoming appointments, etc.  Non-urgent messages can be sent to your provider as well.   To learn more about what you can do with MyChart, go to [email or web address].    Your next appointment:   12 month(s)  The format for your next appointment:   In Person  Provider:   Dr. Harriet Masson    Other Instructions

## 2021-02-06 ENCOUNTER — Telehealth: Payer: Self-pay

## 2021-02-06 NOTE — Telephone Encounter (Signed)
Spoke with patient to let her know Dr. Harriet Masson can not give her medical clearance for [facility]; she will have to see her primary care provider for that. Patient verbalized understanding and states "Tell her I am getting in shape."

## 2021-10-23 IMAGING — CT CT HEART MORP W/ CTA COR W/ SCORE W/ CA W/CM &/OR W/O CM
4 of 7 series · 8 of 20 positions shown, 9 images · IV contrast (APPLIED)
Comparison: None.
COMPARISON: None.

Addendum:
EXAM:
OVER-READ INTERPRETATION  CT CHEST

The following report is an over-read performed by radiologist Dr.
Blain Jumper [REDACTED] on 08/30/2020. This
over-read does not include interpretation of cardiac or coronary
anatomy or pathology. The coronary calcium score/coronary CTA
interpretation by the cardiologist is attached.
CLINICAL DATA: 58 year old female with chest pain and shortness of
breath.
Cardiac/Coronary  CT
TECHNIQUE: The patient was scanned on a [employer] Force scanner.

[Series 6: best diast 79 % · axial · 0.39mm/px · z∈[+1202,+1241]mm · 2 of 291 slices shown, 3 images]
[im 97/291  vessel]
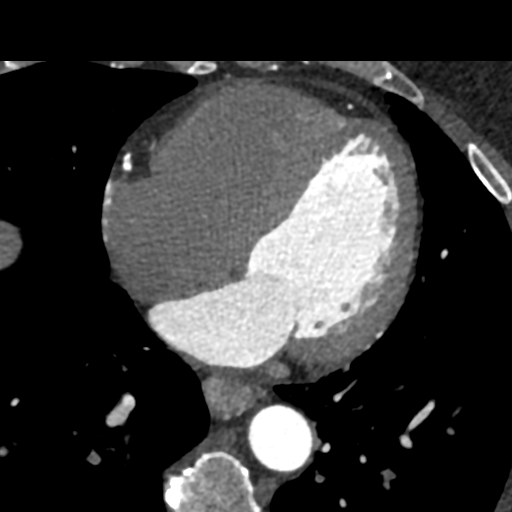
[im 97/291  lung]
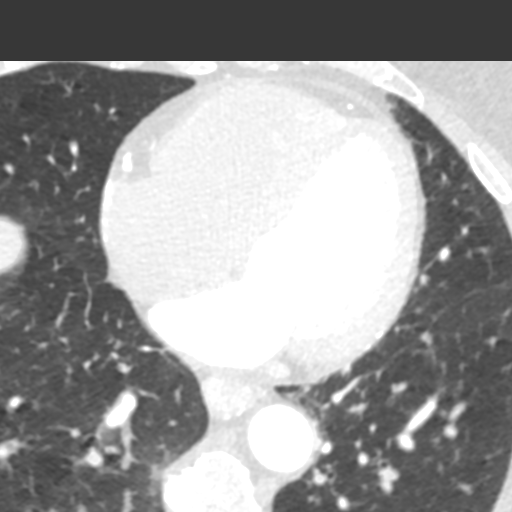
[im 194/291  vessel]
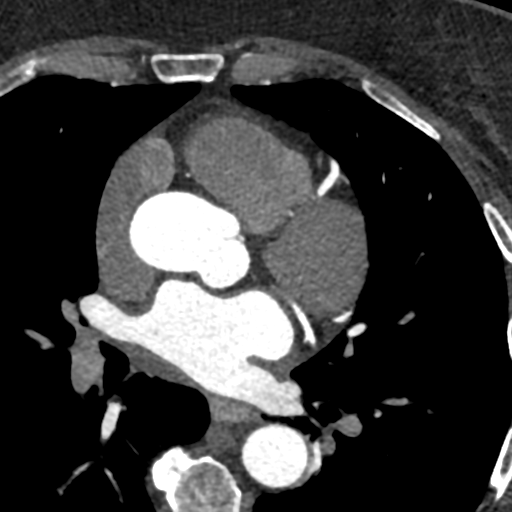

[Series 7: best syst 37 % · axial · 0.39mm/px · z∈[+1202,+1241]mm · 2 of 291 slices shown]
[im 97/291  vessel]
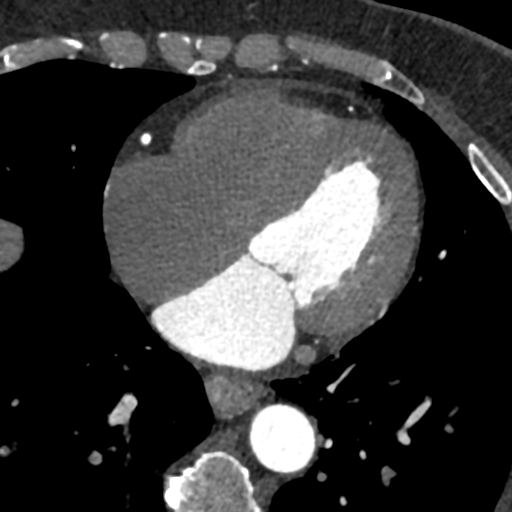
[im 194/291  vessel]
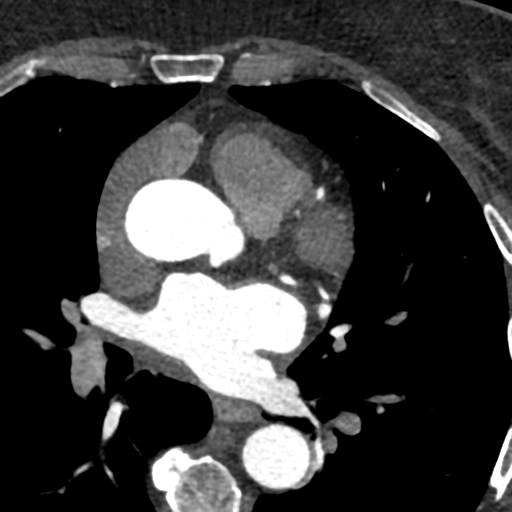

[Series 8: ts diast sharp 79 % · axial · 0.39mm/px · z∈[+1202,+1241]mm · 2 of 291 slices shown]
[im 97/291  lung]
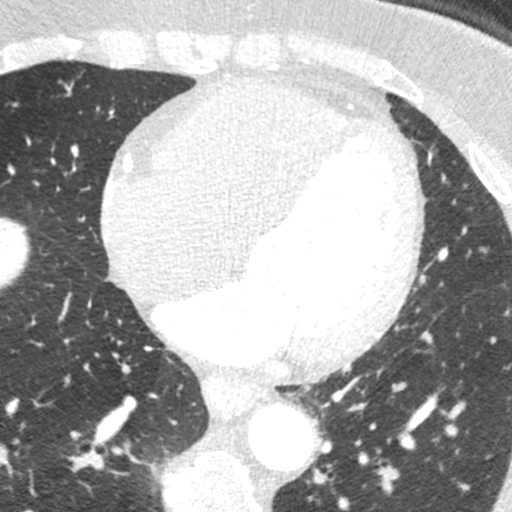
[im 194/291  lung]
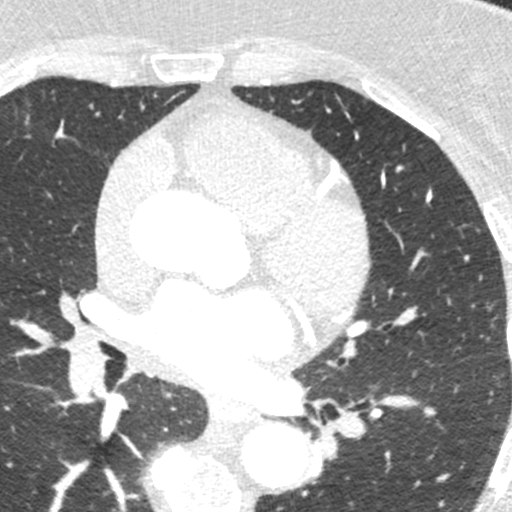

[Series 9: ts syst sharp 37 % · axial · 0.39mm/px · z∈[+1202,+1241]mm · 2 of 291 slices shown]
[im 97/291  lung]
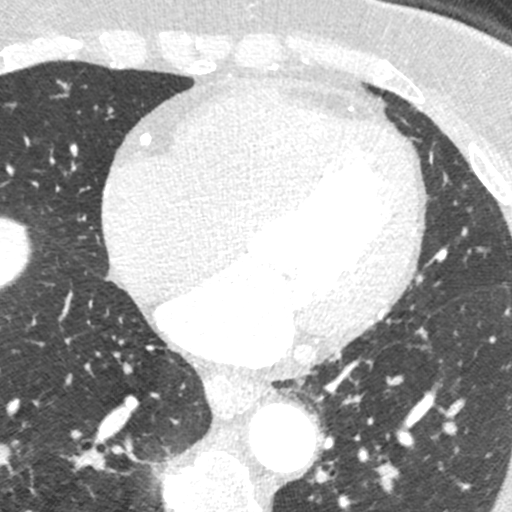
[im 194/291  lung]
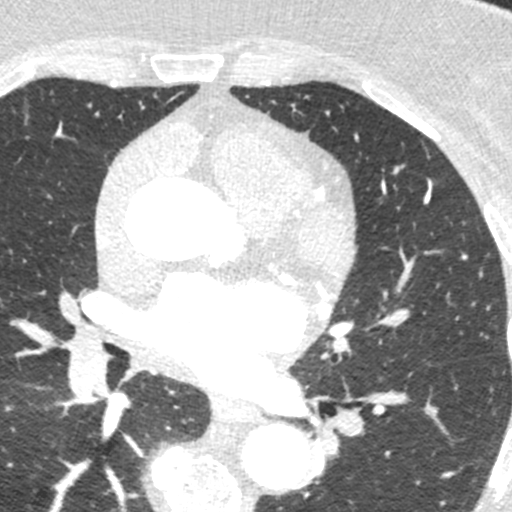

[8 of 20 positions shown; findings below may reference images not displayed]

FINDINGS: Small hiatal hernia. Within the visualized portions of the thorax
there are no suspicious appearing pulmonary nodules or masses, there
is no acute consolidative airspace disease, no pleural effusions, no
pneumothorax and no lymphadenopathy. Visualized portions of the
upper abdomen are unremarkable. There are no aggressive appearing
lytic or blastic lesions noted in the visualized portions of the
skeleton.
IMPRESSION: 1. Small hiatal hernia.
FINDINGS: A 120 kV prospective scan was triggered in the descending thoracic
aorta at 111 HU's. Axial non-contrast 3 mm slices were carried out
through the heart. The data set was analyzed on a dedicated work
station and scored using the Agatson method. Gantry rotation speed
was 250 msecs and collimation was .6 mm. No beta blockade and 0.8 mg
of sl NTG was given. The 3D data set was reconstructed in 5%
intervals of the 67-82 % of the R-R cycle. Diastolic phases were
analyzed on a dedicated work station using MPR, MIP and VRT modes.
The patient received 80 cc of contrast.

Aorta: Normal size.  No calcifications.  No dissection.

Aortic Valve:  Trileaflet.  No calcifications.

Coronary Arteries:  Normal coronary origin.  Right dominance.

RCA is a large dominant artery that gives rise to PDA and PLVB.
There is no plaque.

Left main is a large artery that gives rise to LAD and LCX arteries.

LAD is a large vessel that has no plaque.

LCX is a non-dominant artery that gives rise to one large OM1
branch. There is no plaque.

Other findings:

Normal pulmonary vein drainage into the left atrium.

Normal left atrial appendage without a thrombus.

Normal size of the pulmonary artery.
IMPRESSION: 1. Coronary calcium score of 0. This was 0 percentile for age and
sex matched control.

2. Normal coronary origin with right dominance.

3. No evidence of CAD.

Ambuga Simso, DO

*** End of Addendum ***
EXAM:
OVER-READ INTERPRETATION  CT CHEST

The following report is an over-read performed by radiologist Dr.
Blain Jumper [REDACTED] on 08/30/2020. This
over-read does not include interpretation of cardiac or coronary
anatomy or pathology. The coronary calcium score/coronary CTA
interpretation by the cardiologist is attached.
FINDINGS: Small hiatal hernia. Within the visualized portions of the thorax
there are no suspicious appearing pulmonary nodules or masses, there
is no acute consolidative airspace disease, no pleural effusions, no
pneumothorax and no lymphadenopathy. Visualized portions of the
upper abdomen are unremarkable. There are no aggressive appearing
lytic or blastic lesions noted in the visualized portions of the
skeleton.
IMPRESSION: 1. Small hiatal hernia.

## 2021-10-25 ENCOUNTER — Ambulatory Visit: Payer: BC Managed Care – PPO | Admitting: Cardiology

## 2021-10-25 ENCOUNTER — Encounter: Payer: Self-pay | Admitting: Cardiology

## 2021-10-25 ENCOUNTER — Other Ambulatory Visit: Payer: Self-pay

## 2021-10-25 VITALS — BP 138/92 | HR 61 | Ht 66.0 in | Wt 220.4 lb

## 2021-10-25 DIAGNOSIS — I77810 Thoracic aortic ectasia: Secondary | ICD-10-CM

## 2021-10-25 DIAGNOSIS — E8881 Metabolic syndrome: Secondary | ICD-10-CM

## 2021-10-25 DIAGNOSIS — I471 Supraventricular tachycardia: Secondary | ICD-10-CM

## 2021-10-25 DIAGNOSIS — Z6838 Body mass index (BMI) 38.0-38.9, adult: Secondary | ICD-10-CM

## 2021-10-25 LAB — LIPID PANEL
Chol/HDL Ratio: 2.6 ratio (ref 0.0–4.4)
Cholesterol, Total: 167 mg/dL (ref 100–199)
HDL: 64 mg/dL (ref >39)
LDL Chol Calc (NIH): 93 mg/dL (ref 0–99)
Triglycerides: 50 mg/dL (ref 0–149)
VLDL Cholesterol Cal: 10 mg/dL (ref 5–40)

## 2021-10-25 LAB — HEMOGLOBIN A1C
Est. average glucose Bld gHb Est-mCnc: 117 mg/dL
Hgb A1c MFr Bld: 5.7 % — ABNORMAL HIGH (ref 4.8–5.6)

## 2021-10-25 NOTE — Patient Instructions (Addendum)
Medication Instructions:  Your physician recommends that you continue on your current medications as directed. Please refer to the Current Medication list given to you today.  *If you need a refill on your cardiac medications before your next appointment, please call your pharmacy*   Lab Work: Your physician recommends that you return for lab work in:  TODAY: HbgA1C, Lipids If you have labs (blood work) drawn today and your tests are completely normal, you will receive your results only by: [place] (if you have [place]) OR A paper copy in the mail If you have any lab test that is abnormal or we need to change your treatment, we will call you to review the results.   Testing/Procedures: None   Follow-Up: At [facility], you and your health needs are our priority.  As part of our continuing mission to provide you with exceptional heart care, we have created designated Provider Care Teams.  These Care Teams include your primary Cardiologist (physician) and Advanced Practice Providers (APPs -  Physician Assistants and Nurse Practitioners) who all work together to provide you with the care you need, when you need it.  We recommend signing up for the patient portal called "MyChart".  Sign up information is provided on this After Visit Summary.  MyChart is used to connect with patients for Virtual Visits (Telemedicine).  Patients are able to view lab/test results, encounter notes, upcoming appointments, etc.  Non-urgent messages can be sent to your provider as well.   To learn more about what you can do with MyChart, go to [email or web address].    Your next appointment:   1 year(s)  The format for your next appointment:   In Person  Provider:   Berniece Salines, DO     Other Instructions

## 2021-10-25 NOTE — Progress Notes (Signed)
Cardiology Office Note:    Date:  10/25/2021   ID:  Kelly Good, DOB 07/12/1961, MRN [id number]  PCP:  Randel Books, FNP  Cardiologist:  Berniece Salines, DO  Electrophysiologist:  None   Referring MD: Randel Books, FNP   " I am    History of Present Illness:    Kelly Good is a 60 y.o. female with a hx of vitamin D deficiency, endometriosis is here today for follow-up visit.  Did see the patient back in September 2021 at that time due to her symptoms we order multiple testing including Zio patch, echocardiogram and a coronary CTA.   I saw the patient on December 2,2022 at that time we discussed her testing result.  I also gave the patient metoprolol as needed for palpitations.  She is here today for follow-up visit.  She tells me that she recently had COVID and was treated within the [facility].  During that time she had an x-ray which was concerning for torturous aorta.  There was also mild atelectasis.  Today she tells me that she was told to discuss with her cardiologist about these things.     Past Medical History:  Diagnosis Date   Altered mental status, unspecified    BMI 38.0-38.9,adult    Diarrhea, unspecified    Endometriosis    Other chest pain    Palpitations    Sneezing    Vitamin D deficiency, unspecified     Past Surgical History:  Procedure Laterality Date   ABDOMINAL SURGERY     laporoscopic   CESAREAN SECTION      Current Medications: Current Meds  Medication Sig   albuterol (PROVENTIL HFA;VENTOLIN HFA) 108 (90 BASE) MCG/ACT inhaler Inhale 2 puffs into the lungs every 6 (six) hours as needed (for asthma).   diphenhydrAMINE HCl (BENADRYL ALLERGY PO) Take by mouth as needed.   EPINEPHrine 0.3 mg/0.3 mL IJ SOAJ injection Inject 0.3 mg into the muscle as needed (for allergic reaction).   IBUPROFEN PO Take 200 mg by mouth.   Lactobacillus (ACIDOPHILUS PO) Take 1 capsule by mouth daily.   MAGNESIUM PO Take 1 tablet by mouth  daily. Magnesium taurate   Melatonin 5 MG CAPS Take by mouth at bedtime.   Misc Natural Products (COLON HERBAL CLEANSER PO) Take 1 capsule by mouth as needed (for bowel movement).   nitroGLYCERIN (NITROSTAT) 0.4 MG SL tablet Place 1 tablet (0.4 mg total) under the tongue every 5 (five) minutes as needed for chest pain.   OVER THE COUNTER MEDICATION Take 1 capsule by mouth daily. Catazyme.     Allergies:   Codeine, Doxycycline, Pseudoephedrine hcl, Latex, Other, Tomato, Food, Amoxicillin, Casein, Gluten meal, Lactose intolerance (gi), Percocet [oxycodone-acetaminophen], Polysporin [bacitracin-polymyxin b], Sudafed [pseudoephedrine hcl], and Tilactase   Social History   Socioeconomic History   Marital status: Married    Spouse name: Not on file   Number of children: Not on file   Years of education: Not on file   Highest education level: Not on file  Occupational History   Not on file  Tobacco Use   Smoking status: Never   Smokeless tobacco: Never  Substance and Sexual Activity   Alcohol use: Yes    Comment: 5-7 times a month   Drug use: No   Sexual activity: Not on file  Other Topics Concern   Not on file  Social History Narrative   Not on file   Social Determinants of Health   Financial Resource  Strain: Not on file  Food Insecurity: Not on file  Transportation Needs: Not on file  Physical Activity: Not on file  Stress: Not on file  Social Connections: Not on file     Family History: The patient's family history is not on file. She was adopted.  ROS:   Review of Systems  Constitution: Negative for decreased appetite, fever and weight gain.  HENT: Negative for congestion, ear discharge, hoarse voice and sore throat.   Eyes: Negative for discharge, redness, vision loss in right eye and visual halos.  Cardiovascular: Negative for chest pain, dyspnea on exertion, leg swelling, orthopnea and palpitations.  Respiratory: Negative for cough, hemoptysis, shortness of breath  and snoring.   Endocrine: Negative for heat intolerance and polyphagia.  Hematologic/Lymphatic: Negative for bleeding problem. Does not bruise/bleed easily.  Skin: Negative for flushing, nail changes, rash and suspicious lesions.  Musculoskeletal: Negative for arthritis, joint pain, muscle cramps, myalgias, neck pain and stiffness.  Gastrointestinal: Negative for abdominal pain, bowel incontinence, diarrhea and excessive appetite.  Genitourinary: Negative for decreased libido, genital sores and incomplete emptying.  Neurological: Negative for brief paralysis, focal weakness, headaches and loss of balance.  Psychiatric/Behavioral: Negative for altered mental status, depression and suicidal ideas.  Allergic/Immunologic: Negative for HIV exposure and persistent infections.    EKGs/Labs/Other Studies Reviewed:    The following studies were reviewed today:   EKG:  The ekg ordered today demonstrates sinus rhythm, heart rate 61 bpm   CXR 10/11/2021 TECHNIQUE: XR CHEST PA AND LATERAL Over read interpretation provided.  Primary interpretation provided by on-site provider referenced in electronic medical record at time of over read. 3 films, 2 views.   PRIMARY INTERPRETATION:. AP and lateral views of the chest obtained.   No acute focal infiltrate is identified. History of shortness of breath after recent diagnosis of COVID-19. Results viewed in EMR/EPIC:  Primary read performed by TOD R WARD     AGREE/DISAGREE: Agree with primary interpretation   ADDITIONAL FINDINGS:Tortuous thoracic aorta. Mild probable atelectasis at the left lung base.   Note:  Discordant or unexpected findings sent via [place] to provider and will be reconciled by on-site provider on receipt of radiologist over read.   Electronically Signed by: Elon Alas on 10/14/2021 11:15 AM Procedure Note  Markwalter, Sharlet Salina, MD - 10/14/2021  Formatting of this note might be different from the original.   TECHNIQUE: XR CHEST PA AND LATERAL Over read interpretation provided.  Primary interpretation provided by on-site provider referenced in electronic medical record at time of over read. 3 films, 2 views.   PRIMARY INTERPRETATION:. AP and lateral views of the chest obtained.   No acute focal infiltrate is identified. History of shortness of breath after recent diagnosis of COVID-19. Results viewed in EMR/EPIC:  Primary read performed by TOD R WARD     AGREE/DISAGREE: Agree with primary interpretation   ADDITIONAL FINDINGS:Tortuous thoracic aorta. Mild probable atelectasis at the left lung base.   Note:  Discordant or unexpected findings sent via [place] to provider and will be reconciled by on-site provider on receipt of radiologist over read.   Electronically Signed by: Elon Alas on 10/14/2021 11:15 AM    08/21/2020 IMPRESSIONS     1. Left ventricular ejection fraction, by estimation, is 60 to 65%. The  left ventricle has normal function. The left ventricle has no regional  wall motion abnormalities. There is mild concentric left ventricular  hypertrophy. Left ventricular diastolic  parameters are consistent with Grade  I diastolic dysfunction (impaired  relaxation). The average left ventricular global longitudinal strain is  -9.3 %. The global longitudinal strain is abnormal.   2. Right ventricular systolic function is normal. The right ventricular  size is normal. There is normal pulmonary artery systolic pressure.   3. The mitral valve is normal in structure. Trivial mitral valve  regurgitation. No evidence of mitral stenosis.   4. The aortic valve is normal in structure. Aortic valve regurgitation is  not visualized. No aortic stenosis is present.   5. There is mild dilatation of the ascending aorta, measuring 36 mm.   6. The inferior vena cava is normal in size with greater than 50%  respiratory variability, suggesting right atrial pressure of 3 mmHg.    Comparison(s): No prior Echocardiogram.   FINDINGS   Left Ventricle: Left ventricular ejection fraction, by estimation, is 60  to 65%. The left ventricle has normal function. The left ventricle has no  regional wall motion abnormalities. The average left ventricular global  longitudinal strain is -9.3 %. The   global longitudinal strain is abnormal. The left ventricular internal  cavity size was normal in size. There is mild concentric left ventricular  hypertrophy. Left ventricular diastolic parameters are consistent with  Grade I diastolic dysfunction  (impaired relaxation).   Right Ventricle: The right ventricular size is normal. No increase in  right ventricular wall thickness. Right ventricular systolic function is  normal. There is normal pulmonary artery systolic pressure. The tricuspid  regurgitant velocity is 2.23 m/s, and   with an assumed right atrial pressure of 3 mmHg, the estimated right  ventricular systolic pressure is 27.0 mmHg.   Left Atrium: Left atrial size was normal in size.   Right Atrium: Right atrial size was normal in size. Prominent Eustachian  valve.   Pericardium: There is no evidence of pericardial effusion.   Mitral Valve: The mitral valve is normal in structure. Trivial mitral  valve regurgitation. No evidence of mitral valve stenosis.   Tricuspid Valve: The tricuspid valve is normal in structure. Tricuspid  valve regurgitation is not demonstrated. No evidence of tricuspid  stenosis.   Aortic Valve: The aortic valve is normal in structure. Aortic valve  regurgitation is not visualized. No aortic stenosis is present.   Pulmonic Valve: The pulmonic valve was normal in structure. Pulmonic valve  regurgitation is not visualized. No evidence of pulmonic stenosis.   Aorta: Aortic dilatation noted. There is mild dilatation of the ascending  aorta, measuring 36 mm.   Venous: The inferior vena cava is normal in size with greater than 50%   respiratory variability, suggesting right atrial pressure of 3 mmHg.   IAS/Shunts: No atrial level shunt detected by color flow Doppler.       Recent Labs: No results found for requested labs within last 8760 hours.  Recent Lipid Panel No results found for: CHOL, TRIG, HDL, CHOLHDL, VLDL, LDLCALC, LDLDIRECT  Physical Exam:    VS:  BP (!) 138/92 (BP Location: Left Arm)   Pulse 61   Ht 5\' 6"  (1.676 m)   Wt 220 lb 6.4 oz (100 kg)   LMP 04/22/2013   SpO2 98%   BMI 35.57 kg/m     Wt Readings from Last 3 Encounters:  10/25/21 220 lb 6.4 oz (100 kg)  10/25/20 228 lb (103.4 kg)  07/26/20 228 lb 12.8 oz (103.8 kg)     GEN: Well nourished, well developed in no acute distress HEENT: Normal NECK: No JVD; No carotid  bruits LYMPHATICS: No lymphadenopathy CARDIAC: S1S2 noted,RRR, no murmurs, rubs, gallops RESPIRATORY:  Clear to auscultation without rales, wheezing or rhonchi  ABDOMEN: Soft, non-tender, non-distended, +bowel sounds, no guarding. EXTREMITIES: No edema, No cyanosis, no clubbing MUSCULOSKELETAL:  No deformity  SKIN: Warm and dry NEUROLOGIC:  Alert and oriented x 3, non-focal PSYCHIATRIC:  Normal affect, good insight  ASSESSMENT:    1. PAT (paroxysmal atrial tachycardia) ([place])   2. BMI 38.0-38.9,adult   3. Metabolic syndrome   4. Mild ascending aorta dilatation (HCC)    PLAN:    Her initial blood pressure on presentation was 150/100, at the end of our visit I took the patient blood pressure manually which was 138/98 mmHg.  She is going to check her pressures at home and send this information to me if her average continues to be high we will have no choice but to start antihypertensive medication especially in the setting of her mildly dilated proximal ascending aorta.  We discussed the findings on her chest x-ray which was done on [facility] which showed torturous aorta.  We recently had an echocardiogram which showed mildly dilated proximal ascending aorta 36 mm.  We  went over all of her previous testing.  At this time no need for any further imaging.  The patient understands the need to lose weight with diet and exercise. We have discussed specific strategies for this.  We will get hemoglobin A1c as well as lipid profile today.  The patient is in agreement with the above plan. The patient left the office in stable condition.  The patient will follow up in   Medication Adjustments/Labs and Tests Ordered: Current medicines are reviewed at length with the patient today.  Concerns regarding medicines are outlined above.  Orders Placed This Encounter  Procedures   Lipid panel   Hemoglobin A1c   EKG 12-Lead   No orders of the defined types were placed in this encounter.   Patient Instructions  Medication Instructions:  Your physician recommends that you continue on your current medications as directed. Please refer to the Current Medication list given to you today.  *If you need a refill on your cardiac medications before your next appointment, please call your pharmacy*   Lab Work: Your physician recommends that you return for lab work in:  TODAY: HbgA1C, Lipids If you have labs (blood work) drawn today and your tests are completely normal, you will receive your results only by: [place] (if you have [place]) OR A paper copy in the mail If you have any lab test that is abnormal or we need to change your treatment, we will call you to review the results.   Testing/Procedures: None   Follow-Up: At [facility], you and your health needs are our priority.  As part of our continuing mission to provide you with exceptional heart care, we have created designated Provider Care Teams.  These Care Teams include your primary Cardiologist (physician) and Advanced Practice Providers (APPs -  Physician Assistants and Nurse Practitioners) who all work together to provide you with the care you need, when you need it.  We recommend signing up for  the patient portal called "MyChart".  Sign up information is provided on this After Visit Summary.  MyChart is used to connect with patients for Virtual Visits (Telemedicine).  Patients are able to view lab/test results, encounter notes, upcoming appointments, etc.  Non-urgent messages can be sent to your provider as well.   To learn more about what you can  do with MyChart, go to [email or web address].    Your next appointment:   1 year(s)  The format for your next appointment:   In Person  Provider:   Berniece Salines, DO     Other Instructions     Adopting a Healthy Lifestyle.  Know what a healthy weight is for you (roughly BMI <25) and aim to maintain this   Aim for 7+ servings of fruits and vegetables daily   65-80+ fluid ounces of water or unsweet tea for healthy kidneys   Limit to max 1 drink of alcohol per day; avoid smoking/tobacco   Limit animal fats in diet for cholesterol and heart health - choose grass fed whenever available   Avoid highly processed foods, and foods high in saturated/trans fats   Aim for low stress - take time to unwind and care for your mental health   Aim for 150 min of moderate intensity exercise weekly for heart health, and weights twice weekly for bone health   Aim for 7-9 hours of sleep daily   When it comes to diets, agreement about the perfect plan isnt easy to find, even among the experts. Experts at the [employer] developed an idea known as the Healthy Eating Plate. Just imagine a plate divided into logical, healthy portions.   The emphasis is on diet quality:   Load up on vegetables and fruits - one-half of your plate: Aim for color and variety, and remember that potatoes dont count.   Go for whole grains - one-quarter of your plate: Whole wheat, barley, wheat berries, quinoa, oats, brown rice, and foods made with them. If you want pasta, go with whole wheat pasta.   Protein power - one-quarter of your plate:  Fish, chicken, beans, and nuts are all healthy, versatile protein sources. Limit red meat.   The diet, however, does go beyond the plate, offering a few other suggestions.   Use healthy plant oils, such as olive, canola, soy, corn, sunflower and peanut. Check the labels, and avoid partially hydrogenated oil, which have unhealthy trans fats.   If youre thirsty, drink water. Coffee and tea are good in moderation, but skip sugary drinks and limit milk and dairy products to one or two daily servings.   The type of carbohydrate in the diet is more important than the amount. Some sources of carbohydrates, such as vegetables, fruits, whole grains, and beans-are healthier than others.   Finally, stay active  Signed, Berniece Salines, DO  10/25/2021 10:55 AM    [facility]

## 2022-04-08 ENCOUNTER — Telehealth: Payer: Self-pay | Admitting: Cardiology

## 2022-04-08 NOTE — Telephone Encounter (Signed)
Patient was returning call. She states she doesn't have a PCP dr and needs to get her bp under control. Please advise  ?

## 2022-04-08 NOTE — Telephone Encounter (Signed)
Left message to call back  

## 2022-04-08 NOTE — Telephone Encounter (Signed)
Patient reports she is constantly hungry, has dizziness, racing heart, & tingling fingers and toes. She thinks her blood glucose may be elevated. Her A1C in December was 5.7. She has a history of gestational diabetes. She does not have a PCP. She stated she will go to [facility]  to see if she can get blood work there. She will look for a PCP. ?

## 2022-04-08 NOTE — Telephone Encounter (Signed)
Patient is calling wanting to get her sugar levels checked. States she is wanting to be seen by a diabetes center, but needs a referral in order to do so. Please advise.  ?

## 2022-04-14 ENCOUNTER — Other Ambulatory Visit: Payer: Self-pay

## 2022-04-14 NOTE — Telephone Encounter (Signed)
Patient returned call to triage. Gave her information for [employer] at [facility] [phone]. Once she is seen by a PCP, wants to ask for an endocrinology referral. Patient wanted to know when she should check blood glucose. I told her fasting in the am, 2 hours after lunch, at bedtime, and whenever she feels she needs to check it. I asked her what her BP has been. She has not been checking it. Also, she stated she is not taking metoprolol succinate or met tartrate. Advised her to check BP when she checks her glucose. Her last BP in office was 138/92, P 61.

## 2022-04-14 NOTE — Telephone Encounter (Signed)
LMTCB regarding PCP.

## 2022-05-22 ENCOUNTER — Ambulatory Visit: Payer: BC Managed Care – PPO | Admitting: Nurse Practitioner

## 2022-05-22 VITALS — BP 124/86 | HR 78 | Temp 98.5°F | Ht 66.5 in | Wt 222.0 lb

## 2022-05-22 DIAGNOSIS — Z87892 Personal history of anaphylaxis: Secondary | ICD-10-CM | POA: Diagnosis not present

## 2022-05-22 DIAGNOSIS — R7303 Prediabetes: Secondary | ICD-10-CM | POA: Insufficient documentation

## 2022-05-22 DIAGNOSIS — J4540 Moderate persistent asthma, uncomplicated: Secondary | ICD-10-CM

## 2022-05-22 DIAGNOSIS — M79672 Pain in left foot: Secondary | ICD-10-CM

## 2022-05-22 DIAGNOSIS — M79671 Pain in right foot: Secondary | ICD-10-CM

## 2022-05-22 DIAGNOSIS — N8189 Other female genital prolapse: Secondary | ICD-10-CM

## 2022-05-22 DIAGNOSIS — R002 Palpitations: Secondary | ICD-10-CM

## 2022-05-22 LAB — CBC
HCT: 41.5 % (ref 36.0–46.0)
Hemoglobin: 13.8 g/dL (ref 12.0–15.0)
MCHC: 33.2 g/dL (ref 30.0–36.0)
MCV: 91.3 fl (ref 78.0–100.0)
Platelets: 221 10*3/uL (ref 150.0–400.0)
RBC: 4.54 Mil/uL (ref 3.87–5.11)
RDW: 14.1 % (ref 11.5–15.5)
WBC: 5 10*3/uL (ref 4.0–10.5)

## 2022-05-22 LAB — COMPREHENSIVE METABOLIC PANEL
ALT: 11 U/L (ref 0–35)
AST: 13 U/L (ref 0–37)
Albumin: 4.3 g/dL (ref 3.5–5.2)
Alkaline Phosphatase: 54 U/L (ref 39–117)
BUN: 11 mg/dL (ref 6–23)
CO2: 30 mEq/L (ref 19–32)
Calcium: 9.1 mg/dL (ref 8.4–10.5)
Chloride: 104 mEq/L (ref 96–112)
Creatinine, Ser: 0.74 mg/dL (ref 0.40–1.20)
GFR: 88.48 mL/min (ref >60.00)
Glucose, Bld: 102 mg/dL — ABNORMAL HIGH (ref 70–99)
Potassium: 4.2 mEq/L (ref 3.5–5.1)
Sodium: 140 mEq/L (ref 135–145)
Total Bilirubin: 0.5 mg/dL (ref 0.2–1.2)
Total Protein: 6.7 g/dL (ref 6.0–8.3)

## 2022-05-22 LAB — HEMOGLOBIN A1C: Hgb A1c MFr Bld: 5.8 % (ref 4.6–6.5)

## 2022-05-22 LAB — T3, FREE: T3, Free: 3.2 pg/mL (ref 2.3–4.2)

## 2022-05-22 LAB — TSH: TSH: 1.02 u[IU]/mL (ref 0.35–5.50)

## 2022-05-22 LAB — T4, FREE: Free T4: 0.98 ng/dL (ref 0.60–1.60)

## 2022-05-22 MED ORDER — EPINEPHRINE 0.3 MG/0.3ML IJ SOAJ: 0.3000 mg | INTRAMUSCULAR | 1 refills | Status: AC | PRN

## 2022-05-22 MED ORDER — ALBUTEROL SULFATE HFA 108 (90 BASE) MCG/ACT IN AERS: 2.0000 | INHALATION_SPRAY | Freq: Four times a day (QID) | RESPIRATORY_TRACT | 3 refills | Status: AC | PRN

## 2022-05-22 NOTE — Assessment & Plan Note (Signed)
Chronic, appears intermittent.  Albuterol inhaler refilled today.

## 2022-05-22 NOTE — Assessment & Plan Note (Signed)
Referral to physical therapy made today.

## 2022-05-22 NOTE — Assessment & Plan Note (Signed)
Prescription for up-to-date EpiPen made today.

## 2022-05-22 NOTE — Assessment & Plan Note (Signed)
Chronic, will recheck A1c.  Patient requesting referral to endocrinology as she is concerned she may have an endocrine disorder that has not been diagnosed and could be contributing to her cardiac palpitations.  Referral made today.

## 2022-05-22 NOTE — Assessment & Plan Note (Signed)
Chronic, etiology unclear.  Blood work ordered today for further evaluation.  I recommended patient follow-up with cardiology especially if blood work is unremarkable to reconsider trialing beta-blocker for treatment of her intermittent palpitations.  She reports her understanding.

## 2022-05-22 NOTE — Assessment & Plan Note (Addendum)
No obvious anatomical abnormality noted.  Recommend patient consider trialing inserts or orthotics, if symptoms persist between now and next appointment may consider additional evaluation at follow-up.

## 2022-05-22 NOTE — Progress Notes (Signed)
New Patient Office Visit  Subjective    Patient ID: Kelly Good, female    DOB: [birth year]  Age: 61 y.o. MRN: [id number]  CC:  Chief Complaint  Patient presents with  . New patient    HPI Kelly Good presents to establish care  Palpitations: Chronic has been going on for multiple years.  She does follow with cardiology.  Has been recommended to take metoprolol due to identified short bursts of tachycardia up to 193 on patient's heart rate monitor ordered by cardiologist in the past.  Patient has not taken metoprolol as of now.  She does continue to get intermittent cardiac palpitations.  She is concerned this may be related to either thyroid dysfunction or her prediabetes, she would like to be evaluated further for these concerns prior to starting medication.  Prediabetes: Last A1c was collected approximately 6 months ago was 5.7.  She reports that she has a glucometer that she uses to monitor blood sugar at home and that her blood sugars have been very labile.  Asthma/history of anaphylactic reaction: She is requesting refill on albuterol inhaler that she uses varyingly as needed for asthma.  She also needs a refill on EpiPen as her previous one is now out of date.  She has history of anaphylactic reaction without identified allergen.  Pelvic floor weakness: She has urinary incontinence occurring with sneezing and coughing.  She also notices incontinence if she is unable to make it to the bathroom in time.  She would like to be referred to physical therapy for possible pelvic floor weakness.  Bilateral foot pain: She reports bilateral foot pain that seems to worsen as the day progresses.  She does have a history of plantar fasciitis and actively participates in stretching for treatment of this.   Outpatient Encounter Medications as of 05/22/2022  Medication Sig  . diphenhydrAMINE HCl (BENADRYL ALLERGY PO) Take by mouth as needed.  . IBUPROFEN PO Take 200 mg by mouth.  .  Lactobacillus (ACIDOPHILUS PO) Take 1 capsule by mouth daily.  Marland Kitchen MAGNESIUM PO Take 1 tablet by mouth daily. Magnesium taurate  . Melatonin 5 MG CAPS Take by mouth at bedtime.  . Misc Natural Products (COLON HERBAL CLEANSER PO) Take 1 capsule by mouth as needed (for bowel movement).  . nitroGLYCERIN (NITROSTAT) 0.4 MG SL tablet Place 1 tablet (0.4 mg total) under the tongue every 5 (five) minutes as needed for chest pain.  Marland Kitchen OVER THE COUNTER MEDICATION Take 1 capsule by mouth daily. [employer]ata[employer].  . [DISCONTINUED] albuterol (PROVENTIL HFA;VENTOLIN HFA) 108 (90 BASE) MCG/ACT inhaler Inhale 2 puffs into the lungs every 6 (six) hours as needed (for asthma).  . [DISCONTINUED] EPINEPHrine 0.3 mg/0.3 mL IJ SOAJ injection Inject 0.3 mg into the muscle as needed (for allergic reaction).  . [DISCONTINUED] metoprolol succinate (TOPROL-XL) 25 MG 24 hr tablet Take 0.5 tablets (12.5 mg total) by mouth daily.  . [DISCONTINUED] metoprolol tartrate (LOPRESSOR) 100 MG tablet Take 1 tablet (100 mg total) by mouth as directed. Take 2 hours prior to CT  . albuterol (VENTOLIN HFA) 108 (90 Base) MCG/ACT inhaler Inhale 2 puffs into the lungs every 6 (six) hours as needed (for asthma).  . EPINEPHrine 0.3 mg/0.3 mL IJ SOAJ injection Inject 0.3 mg into the muscle as needed (for allergic reaction).  Marland Kitchen erythromycin ophthalmic ointment 2 (two) times daily.   No facility-administered encounter medications on file as of 05/22/2022.    Past Medical History:  Diagnosis Date  . Altered mental status,  unspecified   . BMI 38.0-38.9,adult   . Diarrhea, unspecified   . Endometriosis   . Other chest pain   . Palpitations   . Sneezing   . Vitamin D deficiency, unspecified     Past Surgical History:  Procedure Laterality Date  . ABDOMINAL SURGERY     laporoscopic  . CESAREAN SECTION      Family History  Adopted: Yes    Social History   Socioeconomic History  . Marital status: Married    Spouse name: Not on file  .  Number of children: Not on file  . Years of education: Not on file  . Highest education level: Not on file  Occupational History  . Not on file  Tobacco Use  . Smoking status: Never  . Smokeless tobacco: Never  Substance and Sexual Activity  . Alcohol use: Yes    Comment: 5-7 times a month  . Drug use: No  . Sexual activity: Not on file  Other Topics Concern  . Not on file  Social History Narrative  . Not on file   Social Determinants of Health   Financial Resource Strain: Not on file  Food Insecurity: Not on file  Transportation Needs: Not on file  Physical Activity: Not on file  Stress: Not on file  Social Connections: Not on file  Intimate Partner Violence: Not on file    Review of Systems  Cardiovascular:  Positive for palpitations.  Genitourinary:  Positive for urgency.  Musculoskeletal:  Positive for joint pain.       Objective    BP 124/86 (BP Location: Left Arm, Patient Position: Sitting, Cuff Size: Large)   Pulse 78   Temp 98.5 F (36.9 C) (Oral)   Ht 5' 6.5" (1.689 m)   Wt 222 lb (100.7 kg)   LMP 04/22/2013   SpO2 91%   BMI 35.29 kg/m   Physical Exam Vitals reviewed.  Constitutional:      General: She is not in acute distress.    Appearance: Normal appearance.  HENT:     Head: Normocephalic and atraumatic.  Neck:     Vascular: No carotid bruit.  Cardiovascular:     Rate and Rhythm: Normal rate and regular rhythm.     Pulses: Normal pulses.          Dorsalis pedis pulses are 2+ on the right side and 2+ on the left side.     Heart sounds: Normal heart sounds.  Pulmonary:     Effort: Pulmonary effort is normal.     Breath sounds: Normal breath sounds.  Musculoskeletal:     Right foot: Normal range of motion. No foot drop.     Left foot: Normal range of motion. Deformity (large gap between first and second digits, occured following trauma with flip flop) present. No foot drop.  Feet:     Right foot:     Skin integrity: Skin integrity  normal.     Toenail Condition: Right toenails are normal.     Left foot:     Skin integrity: Skin integrity normal.     Toenail Condition: Left toenails are normal.  Skin:    General: Skin is warm and dry.  Neurological:     General: No focal deficit present.     Mental Status: She is alert and oriented to person, place, and time.  Psychiatric:        Mood and Affect: Mood normal.        Behavior: Behavior normal.  Judgment: Judgment normal.        Assessment & Plan:   Problem List Items Addressed This Visit       Respiratory   Asthma    Chronic, appears intermittent.  Albuterol inhaler refilled today.      Relevant Medications   albuterol (VENTOLIN HFA) 108 (90 Base) MCG/ACT inhaler     Other   Palpitations - Primary    Chronic, etiology unclear.  Blood work ordered today for further evaluation.  I recommended patient follow-up with cardiology especially if blood work is unremarkable to reconsider trialing beta-blocker for treatment of her intermittent palpitations.  She reports her understanding.      Relevant Orders   TSH   Hemoglobin A1c   Comprehensive metabolic panel   CBC   T3, free   T4, free   Ambulatory referral to Endocrinology   Prediabetes    Chronic, will recheck A1c.  Patient requesting referral to endocrinology as she is concerned she may have an endocrine disorder that has not been diagnosed and could be contributing to her cardiac palpitations.  Referral made today.      Relevant Orders   TSH   Hemoglobin A1c   Comprehensive metabolic panel   CBC   T3, free   T4, free   Ambulatory referral to Endocrinology   History of anaphylaxis    Prescription for up-to-date EpiPen made today.      Relevant Medications   EPINEPHrine 0.3 mg/0.3 mL IJ SOAJ injection   Pelvic floor weakness in female    Referral to physical therapy made today.      Relevant Orders   Ambulatory referral to Physical Therapy   Bilateral foot pain    No obvious  anatomical abnormality noted.  Recommend patient consider trialing inserts or orthotics, if symptoms persist between now and next appointment may consider additional evaluation at follow-up.       Return in about 1 month (around 06/21/2022) for Follow-up with Vaughan Garfinkle.   Ailene Ards, NP

## 2022-06-26 ENCOUNTER — Ambulatory Visit: Payer: BC Managed Care – PPO | Admitting: Nurse Practitioner

## 2022-06-26 VITALS — BP 140/84 | HR 78 | Temp 98.5°F | Ht 66.5 in | Wt 222.0 lb

## 2022-06-26 DIAGNOSIS — Z1211 Encounter for screening for malignant neoplasm of colon: Secondary | ICD-10-CM | POA: Insufficient documentation

## 2022-06-26 DIAGNOSIS — Z13220 Encounter for screening for lipoid disorders: Secondary | ICD-10-CM | POA: Insufficient documentation

## 2022-06-26 DIAGNOSIS — K219 Gastro-esophageal reflux disease without esophagitis: Secondary | ICD-10-CM | POA: Diagnosis not present

## 2022-06-26 DIAGNOSIS — N8189 Other female genital prolapse: Secondary | ICD-10-CM

## 2022-06-26 DIAGNOSIS — Z1159 Encounter for screening for other viral diseases: Secondary | ICD-10-CM | POA: Diagnosis not present

## 2022-06-26 NOTE — Assessment & Plan Note (Signed)
Referral to GI made today to consider undergoing screening colonoscopy.

## 2022-06-26 NOTE — Progress Notes (Signed)
Established Patient Office Visit  Subjective   Patient ID: Kelly Good, female    DOB: [birth year]  Age: 61 y.o. MRN: [id number]  Chief Complaint  Patient presents with   41mofollow up    Patient arrives today to discuss lab work results.  Labs were significant for mildly reduced GFR at 88, A1c of 5.8.  Thyroid panel within normal limits.  Patient continues to have occasional chest pain and cardiac palpitations.  She does report that she is planning on following back up with cardiology for further evaluation of this.  She reports a longstanding history of GERD (~30 years) which now requires almost daily.  Use of baking soda dissolved in water.  She also reports that she is due for colon cancer screening.  She denies ever having undergone endoscopy for evaluation of her GERD.  She continues to have pelvic floor weakness and would like manipulative physical therapy for this.  She follows with OB/GYN and is recommended to continue with Pap smears.  She reports she has an appointment coming up with them shortly at which point she plans on getting Pap smear and mammogram completed.    Review of Systems  Constitutional:  Negative for fever and malaise/fatigue.  Cardiovascular:  Positive for chest pain (occasional - patient feels it may be GERD, will spontaneously resolve) and palpitations.  Gastrointestinal:  Positive for heartburn.      Objective:     BP (!) 140/84   Pulse 78   Temp 98.5 F (36.9 C) (Oral)   Ht 5' 6.5" (1.689 m)   Wt 222 lb (100.7 kg)   LMP 04/22/2013   SpO2 94%   BMI 35.29 kg/m  BP Readings from Last 3 Encounters:  06/26/22 (!) 140/84  05/22/22 124/86  10/25/21 (!) 138/92      Physical Exam Vitals reviewed.  Constitutional:      General: She is not in acute distress.    Appearance: Normal appearance.  HENT:     Head: Normocephalic and atraumatic.  Neck:     Vascular: No carotid bruit.  Cardiovascular:     Rate and Rhythm: Normal rate and regular  rhythm.     Pulses: Normal pulses.     Heart sounds: Normal heart sounds.  Pulmonary:     Effort: Pulmonary effort is normal.     Breath sounds: Normal breath sounds.  Skin:    General: Skin is warm and dry.  Neurological:     General: No focal deficit present.     Mental Status: She is alert and oriented to person, place, and time.  Psychiatric:        Mood and Affect: Mood normal.        Behavior: Behavior normal.        Judgment: Judgment normal.      No results found for any visits on 06/26/22.    The 10-year ASCVD risk score (Arnett DK, et al., 2019) is: 2.7%    Assessment & Plan:   Problem List Items Addressed This Visit       Digestive   Gastroesophageal reflux disease    Referral to GI made today for patient undergo screening colonoscopy as well as to consider endoscopy for further evaluation of her GERD.      Relevant Medications   lactobacillus acidophilus (BACID) TABS tablet   Other Relevant Orders   Ambulatory referral to Gastroenterology     Other   Pelvic floor weakness in female    Referral to  different physical therapist for assistance with manipulative therapy to assist with pelvic floor weakness.      Relevant Orders   Ambulatory referral to Physical Therapy   Colon cancer screening - Primary    Referral to GI made today to consider undergoing screening colonoscopy.      Relevant Orders   Ambulatory referral to Gastroenterology   Encounter for hepatitis C screening test for low risk patient    Hep C antibody ordered today, further recommendations may be made based upon these results.      Relevant Orders   Hepatitis C antibody    Return in about 3 months (around 09/26/2022) for 3-6 months with Judson Roch for CPE.    Ailene Ards, NP

## 2022-06-26 NOTE — Assessment & Plan Note (Signed)
Hep C antibody ordered today, further recommendations may be made based upon these results. 

## 2022-06-26 NOTE — Assessment & Plan Note (Signed)
Referral to different physical therapist for assistance with manipulative therapy to assist with pelvic floor weakness.

## 2022-06-26 NOTE — Assessment & Plan Note (Signed)
Referral to GI made today for patient undergo screening colonoscopy as well as to consider endoscopy for further evaluation of her GERD.

## 2022-06-27 LAB — HEPATITIS C ANTIBODY: Hepatitis C Ab: NONREACTIVE

## 2022-08-13 ENCOUNTER — Encounter: Payer: Self-pay | Admitting: Cardiology

## 2022-08-13 ENCOUNTER — Ambulatory Visit: Payer: BC Managed Care – PPO | Attending: Cardiology | Admitting: Cardiology

## 2022-08-13 VITALS — BP 124/82 | HR 84 | Ht 66.5 in | Wt 222.2 lb

## 2022-08-13 DIAGNOSIS — Z6838 Body mass index (BMI) 38.0-38.9, adult: Secondary | ICD-10-CM | POA: Diagnosis not present

## 2022-08-13 DIAGNOSIS — I471 Supraventricular tachycardia: Secondary | ICD-10-CM | POA: Diagnosis not present

## 2022-08-13 DIAGNOSIS — R7303 Prediabetes: Secondary | ICD-10-CM

## 2022-08-13 MED ORDER — PROPRANOLOL HCL 10 MG PO TABS: 10.0000 mg | ORAL_TABLET | Freq: Two times a day (BID) | ORAL | 3 refills | Status: DC

## 2022-08-13 NOTE — Progress Notes (Signed)
Cardiology Office Note:    Date:  08/17/2022   ID:  Kelly Good, DOB 1961-06-14, MRN [id number]  PCP:  Ailene Ards, NP  Cardiologist:  Berniece Salines, DO  Electrophysiologist:  None   Referring MD: Ailene Ards, NP   " I am    History of Present Illness:    Kelly Good is a 62 y.o. female with a hx of vitamin D deficiency, endometriosis is here today for follow-up visit.  Did see the patient back in September 2021 at that time due to her symptoms we order multiple testing including Zio patch, echocardiogram and a coronary CTA.   I saw the patient on December 2,2022 at that time we discussed her testing result.  I also gave the patient metoprolol as needed for palpitations.   At her last visit she was was hypertensive but this was an isolated reading therefore we decided to monitor.  She is here today for a follow up visit. She is experiencing palpitations.  She denies any palpitation increasing in offer no other complaints at this time.    Past Medical History:  Diagnosis Date   Altered mental status, unspecified    BMI 38.0-38.9,adult    Diarrhea, unspecified    Endometriosis    Other chest pain    Palpitations    Sneezing    Vitamin D deficiency, unspecified     Past Surgical History:  Procedure Laterality Date   ABDOMINAL SURGERY     laporoscopic   CESAREAN SECTION      Current Medications: Current Meds  Medication Sig   albuterol (VENTOLIN HFA) 108 (90 Base) MCG/ACT inhaler Inhale 2 puffs into the lungs every 6 (six) hours as needed (for asthma).   diphenhydrAMINE HCl (BENADRYL ALLERGY PO) Take by mouth as needed.   EPINEPHrine 0.3 mg/0.3 mL IJ SOAJ injection Inject 0.3 mg into the muscle as needed (for allergic reaction).   IBUPROFEN PO Take 200 mg by mouth.   lactobacillus acidophilus (BACID) TABS tablet Take 2 tablets by mouth daily.   MAGNESIUM PO Take 1 tablet by mouth daily. Magnesium taurate   Melatonin 5 MG CAPS Take by mouth at bedtime.   Misc  Natural Products (COLON HERBAL CLEANSER PO) Take 1 capsule by mouth as needed (for bowel movement).   nitroGLYCERIN (NITROSTAT) 0.4 MG SL tablet Place 1 tablet (0.4 mg total) under the tongue every 5 (five) minutes as needed for chest pain.   OVER THE COUNTER MEDICATION Take 1 capsule by mouth daily. Catazyme.   propranolol (INDERAL) 10 MG tablet Take 1 tablet (10 mg total) by mouth 2 (two) times daily.     Allergies:   Codeine, Doxycycline, Pseudoephedrine hcl, Latex, Other, Tomato, Food, Amoxicillin, Casein, Gluten meal, Lactose intolerance (gi), Percocet [oxycodone-acetaminophen], Polysporin [bacitracin-polymyxin b], Sudafed [pseudoephedrine hcl], and Tilactase   Social History   Socioeconomic History   Marital status: Married    Spouse name: Not on file   Number of children: Not on file   Years of education: Not on file   Highest education level: Not on file  Occupational History   Not on file  Tobacco Use   Smoking status: Never   Smokeless tobacco: Never  Substance and Sexual Activity   Alcohol use: Yes    Comment: 5-7 times a month   Drug use: No   Sexual activity: Not on file  Other Topics Concern   Not on file  Social History Narrative   Not on file   Social Determinants  of Health   Financial Resource Strain: Not on file  Food Insecurity: Not on file  Transportation Needs: Not on file  Physical Activity: Not on file  Stress: Not on file  Social Connections: Not on file     Family History: The patient's family history is not on file. She was adopted.  ROS:   Review of Systems  Constitution: Negative for decreased appetite, fever and weight gain.  HENT: Negative for congestion, ear discharge, hoarse voice and sore throat.   Eyes: Negative for discharge, redness, vision loss in right eye and visual halos.  Cardiovascular: Negative for chest pain, dyspnea on exertion, leg swelling, orthopnea and palpitations.  Respiratory: Negative for cough, hemoptysis,  shortness of breath and snoring.   Endocrine: Negative for heat intolerance and polyphagia.  Hematologic/Lymphatic: Negative for bleeding problem. Does not bruise/bleed easily.  Skin: Negative for flushing, nail changes, rash and suspicious lesions.  Musculoskeletal: Negative for arthritis, joint pain, muscle cramps, myalgias, neck pain and stiffness.  Gastrointestinal: Negative for abdominal pain, bowel incontinence, diarrhea and excessive appetite.  Genitourinary: Negative for decreased libido, genital sores and incomplete emptying.  Neurological: Negative for brief paralysis, focal weakness, headaches and loss of balance.  Psychiatric/Behavioral: Negative for altered mental status, depression and suicidal ideas.  Allergic/Immunologic: Negative for HIV exposure and persistent infections.    EKGs/Labs/Other Studies Reviewed:    The following studies were reviewed today:   EKG:  The ekg ordered today demonstrates sinus rhythm, heart rate 61 bpm   CXR 10/11/2021 TECHNIQUE: XR CHEST PA AND LATERAL Over read interpretation provided.  Primary interpretation provided by on-site provider referenced in electronic medical record at time of over read. 3 films, 2 views.   PRIMARY INTERPRETATION:. AP and lateral views of the chest obtained.   No acute focal infiltrate is identified. History of shortness of breath after recent diagnosis of COVID-19. Results viewed in EMR/EPIC:  Primary read performed by TOD R WARD     AGREE/DISAGREE: Agree with primary interpretation   ADDITIONAL FINDINGS:Tortuous thoracic aorta. Mild probable atelectasis at the left lung base.   Note:  Discordant or unexpected findings sent via [place] to provider and will be reconciled by on-site provider on receipt of radiologist over read.   Electronically Signed by: Elon Alas on 10/14/2021 11:15 AM Procedure Note  Markwalter, Sharlet Salina, MD - 10/14/2021  Formatting of this note might be different from  the original.  TECHNIQUE: XR CHEST PA AND LATERAL Over read interpretation provided.  Primary interpretation provided by on-site provider referenced in electronic medical record at time of over read. 3 films, 2 views.   PRIMARY INTERPRETATION:. AP and lateral views of the chest obtained.   No acute focal infiltrate is identified. History of shortness of breath after recent diagnosis of COVID-19. Results viewed in EMR/EPIC:  Primary read performed by TOD R WARD     AGREE/DISAGREE: Agree with primary interpretation   ADDITIONAL FINDINGS:Tortuous thoracic aorta. Mild probable atelectasis at the left lung base.   Note:  Discordant or unexpected findings sent via [place] to provider and will be reconciled by on-site provider on receipt of radiologist over read.   Electronically Signed by: Elon Alas on 10/14/2021 11:15 AM    08/21/2020 IMPRESSIONS     1. Left ventricular ejection fraction, by estimation, is 60 to 65%. The  left ventricle has normal function. The left ventricle has no regional  wall motion abnormalities. There is mild concentric left ventricular  hypertrophy. Left ventricular diastolic  parameters are consistent with Grade I diastolic dysfunction (impaired  relaxation). The average left ventricular global longitudinal strain is  -9.3 %. The global longitudinal strain is abnormal.   2. Right ventricular systolic function is normal. The right ventricular  size is normal. There is normal pulmonary artery systolic pressure.   3. The mitral valve is normal in structure. Trivial mitral valve  regurgitation. No evidence of mitral stenosis.   4. The aortic valve is normal in structure. Aortic valve regurgitation is  not visualized. No aortic stenosis is present.   5. There is mild dilatation of the ascending aorta, measuring 36 mm.   6. The inferior vena cava is normal in size with greater than 50%  respiratory variability, suggesting right atrial pressure of 3  mmHg.   Comparison(s): No prior Echocardiogram.   FINDINGS   Left Ventricle: Left ventricular ejection fraction, by estimation, is 60  to 65%. The left ventricle has normal function. The left ventricle has no  regional wall motion abnormalities. The average left ventricular global  longitudinal strain is -9.3 %. The   global longitudinal strain is abnormal. The left ventricular internal  cavity size was normal in size. There is mild concentric left ventricular  hypertrophy. Left ventricular diastolic parameters are consistent with  Grade I diastolic dysfunction  (impaired relaxation).   Right Ventricle: The right ventricular size is normal. No increase in  right ventricular wall thickness. Right ventricular systolic function is  normal. There is normal pulmonary artery systolic pressure. The tricuspid  regurgitant velocity is 2.23 m/s, and   with an assumed right atrial pressure of 3 mmHg, the estimated right  ventricular systolic pressure is 65.7 mmHg.   Left Atrium: Left atrial size was normal in size.   Right Atrium: Right atrial size was normal in size. Prominent Eustachian  valve.   Pericardium: There is no evidence of pericardial effusion.   Mitral Valve: The mitral valve is normal in structure. Trivial mitral  valve regurgitation. No evidence of mitral valve stenosis.   Tricuspid Valve: The tricuspid valve is normal in structure. Tricuspid  valve regurgitation is not demonstrated. No evidence of tricuspid  stenosis.   Aortic Valve: The aortic valve is normal in structure. Aortic valve  regurgitation is not visualized. No aortic stenosis is present.   Pulmonic Valve: The pulmonic valve was normal in structure. Pulmonic valve  regurgitation is not visualized. No evidence of pulmonic stenosis.   Aorta: Aortic dilatation noted. There is mild dilatation of the ascending  aorta, measuring 36 mm.   Venous: The inferior vena cava is normal in size with greater than 50%   respiratory variability, suggesting right atrial pressure of 3 mmHg.   IAS/Shunts: No atrial level shunt detected by color flow Doppler.       Recent Labs: 05/22/2022: ALT 11; BUN 11; Creatinine, Ser 0.74; Hemoglobin 13.8; Platelets 221.0; Potassium 4.2; Sodium 140; TSH 1.02  Recent Lipid Panel    Component Value Date/Time   CHOL 167 10/25/2021 1048   TRIG 50 10/25/2021 1048   HDL 64 10/25/2021 1048   CHOLHDL 2.6 10/25/2021 1048   LDLCALC 93 10/25/2021 1048    Physical Exam:    VS:  BP 124/82   Pulse 84   Ht 5' 6.5" (1.689 m)   Wt 222 lb 3.2 oz (100.8 kg)   LMP 04/22/2013   SpO2 98%   BMI 35.33 kg/m     Wt Readings from Last 3 Encounters:  08/13/22 222 lb 3.2 oz (100.8  kg)  06/26/22 222 lb (100.7 kg)  05/22/22 222 lb (100.7 kg)     GEN: Well nourished, well developed in no acute distress HEENT: Normal NECK: No JVD; No carotid bruits LYMPHATICS: No lymphadenopathy CARDIAC: S1S2 noted,RRR, no murmurs, rubs, gallops RESPIRATORY:  Clear to auscultation without rales, wheezing or rhonchi  ABDOMEN: Soft, non-tender, non-distended, +bowel sounds, no guarding. EXTREMITIES: No edema, No cyanosis, no clubbing MUSCULOSKELETAL:  No deformity  SKIN: Warm and dry NEUROLOGIC:  Alert and oriented x 3, non-focal PSYCHIATRIC:  Normal affect, good insight  ASSESSMENT:    1. PAT (paroxysmal atrial tachycardia) ([place])   2. BMI 38.0-38.9,adult   3. Prediabetes     PLAN:    She is symptomatic with the palpitations with known paroxysmal atrial tachycardia was discussed the patient understands the need to lose weight with diet and exercise. We have discussed specific strategies for this. Her blood pressure is within normal limits.  We will continue to monitor. The patient is in agreement with the above plan. The patient left the office in stable condition.  The patient will follow up in   Medication Adjustments/Labs and Tests Ordered: Current medicines are reviewed at length  with the patient today.  Concerns regarding medicines are outlined above.  No orders of the defined types were placed in this encounter.  Meds ordered this encounter  Medications   propranolol (INDERAL) 10 MG tablet    Sig: Take 1 tablet (10 mg total) by mouth 2 (two) times daily.    Dispense:  180 tablet    Refill:  3     Patient Instructions  Medication Instructions:  Your physician has recommended you make the following change in your medication:  START: Propranolol 10 mg twice daily *If you need a refill on your cardiac medications before your next appointment, please call your pharmacy*   Lab Work: None  Testing/Procedures: None   Follow-Up: At [facility], you and your health needs are our priority.  As part of our continuing mission to provide you with exceptional heart care, we have created designated Provider Care Teams.  These Care Teams include your primary Cardiologist (physician) and Advanced Practice Providers (APPs -  Physician Assistants and Nurse Practitioners) who all work together to provide you with the care you need, when you need it.  We recommend signing up for the patient portal called "MyChart".  Sign up information is provided on this After Visit Summary.  MyChart is used to connect with patients for Virtual Visits (Telemedicine).  Patients are able to view lab/test results, encounter notes, upcoming appointments, etc.  Non-urgent messages can be sent to your provider as well.   To learn more about what you can do with MyChart, go to [email or web address].    Your next appointment:   6 month(s)  The format for your next appointment:   In Person  Provider:   Berniece Salines, DO     Other Instructions   Important Information About Sugar         Adopting a Healthy Lifestyle.  Know what a healthy weight is for you (roughly BMI <25) and aim to maintain this   Aim for 7+ servings of fruits and vegetables daily   65-80+ fluid ounces  of water or unsweet tea for healthy kidneys   Limit to max 1 drink of alcohol per day; avoid smoking/tobacco   Limit animal fats in diet for cholesterol and heart health - choose grass fed whenever available   Avoid highly processed  foods, and foods high in saturated/trans fats   Aim for low stress - take time to unwind and care for your mental health   Aim for 150 min of moderate intensity exercise weekly for heart health, and weights twice weekly for bone health   Aim for 7-9 hours of sleep daily   When it comes to diets, agreement about the perfect plan isnt easy to find, even among the experts. Experts at the [employer] developed an idea known as the Healthy Eating Plate. Just imagine a plate divided into logical, healthy portions.   The emphasis is on diet quality:   Load up on vegetables and fruits - one-half of your plate: Aim for color and variety, and remember that potatoes dont count.   Go for whole grains - one-quarter of your plate: Whole wheat, barley, wheat berries, quinoa, oats, brown rice, and foods made with them. If you want pasta, go with whole wheat pasta.   Protein power - one-quarter of your plate: Fish, chicken, beans, and nuts are all healthy, versatile protein sources. Limit red meat.   The diet, however, does go beyond the plate, offering a few other suggestions.   Use healthy plant oils, such as olive, canola, soy, corn, sunflower and peanut. Check the labels, and avoid partially hydrogenated oil, which have unhealthy trans fats.   If youre thirsty, drink water. Coffee and tea are good in moderation, but skip sugary drinks and limit milk and dairy products to one or two daily servings.   The type of carbohydrate in the diet is more important than the amount. Some sources of carbohydrates, such as vegetables, fruits, whole grains, and beans-are healthier than others.   Finally, stay active  Signed, Berniece Salines, DO  08/17/2022 4:25  PM    [facility]

## 2022-08-13 NOTE — Patient Instructions (Signed)
Medication Instructions:  Your physician has recommended you make the following change in your medication:  START: Propranolol 10 mg twice daily *If you need a refill on your cardiac medications before your next appointment, please call your pharmacy*   Lab Work: None  Testing/Procedures: None   Follow-Up: At [facility], you and your health needs are our priority.  As part of our continuing mission to provide you with exceptional heart care, we have created designated Provider Care Teams.  These Care Teams include your primary Cardiologist (physician) and Advanced Practice Providers (APPs -  Physician Assistants and Nurse Practitioners) who all work together to provide you with the care you need, when you need it.  We recommend signing up for the patient portal called "MyChart".  Sign up information is provided on this After Visit Summary.  MyChart is used to connect with patients for Virtual Visits (Telemedicine).  Patients are able to view lab/test results, encounter notes, upcoming appointments, etc.  Non-urgent messages can be sent to your provider as well.   To learn more about what you can do with MyChart, go to [email or web address].    Your next appointment:   6 month(s)  The format for your next appointment:   In Person  Provider:   Berniece Salines, DO     Other Instructions   Important Information About Sugar

## 2022-12-18 ENCOUNTER — Encounter: Payer: BC Managed Care – PPO | Admitting: Nurse Practitioner

## 2022-12-25 ENCOUNTER — Encounter: Payer: Self-pay | Admitting: Cardiology

## 2022-12-31 NOTE — Progress Notes (Signed)
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  Past Medical History    Past Medical History:  Diagnosis Date   Altered mental status, unspecified    BMI 38.0-38.9,adult    Diarrhea, unspecified    Endometriosis    Other chest pain    Palpitations    Sneezing    Vitamin D deficiency, unspecified    Past Surgical History:  Procedure Laterality Date   ABDOMINAL SURGERY     laporoscopic   CESAREAN SECTION      Allergies  Allergies  Allergen Reactions   Codeine Shortness Of Breath and Other (See Comments)    Paradoxical response Hyper    Doxycycline Shortness Of Breath   Propanediol Shortness Of Breath    Wheezing, chest tightness, lethargy.    Pseudoephedrine Hcl Other (See Comments)    Paradoxical reaction Hyper/tingly    Latex Rash   Other Other (See Comments)    Peppers caused gastritis   Tomato Other (See Comments)    Gastritis   Amoxicillin Rash    Stomach ache    Casein Other (See Comments)   Food     Grains.   Gluten Meal Other (See Comments)    Upset stomach.   Lactose Intolerance (Gi) Other (See Comments)    Upset stomach.   Percocet [Oxycodone-Acetaminophen] Other (See Comments)    "made patient feel drunk"   Polysporin [Bacitracin-Polymyxin B] Rash   Sudafed [Pseudoephedrine Hcl] Other (See Comments)    Hyper   Tilactase Other (See Comments)    History of Present Illness    Mr. Ingram comes today with complaints of "feeling off around my heart" with feelings of breathlessness and fatigue. She did pull some  muscles between her shoulder blades recently but is experiencing spasms in her chest with occasional sharp pains. She asked for a sooner appointment.  She states she feels foggy and out of it when she takes the propranolol at nighttime.  She has been very careful not to take it at the same time she is drinking alcohol.  She has noted increasing wheezing, chest pain, and lethargy since beginning this medication.  Home Medications    Current Outpatient Medications  Medication Sig Dispense Refill   albuterol (VENTOLIN HFA) 108 (90 Base) MCG/ACT inhaler Inhale 2 puffs into the lungs every 6 (six) hours as needed (for asthma). 8 g 3   bisoprolol (ZEBETA) 5 MG tablet Take 0.5 tablets (2.5 mg total) by mouth as needed. 30 tablet 6   diphenhydrAMINE HCl (BENADRYL ALLERGY PO) Take by mouth as needed.     EPINEPHrine 0.3 mg/0.3 mL IJ SOAJ injection Inject 0.3 mg into the muscle as needed (for allergic reaction). 1 each 1   IBUPROFEN PO Take 200 mg by mouth.     lactobacillus acidophilus (BACID) TABS tablet Take 2 tablets by mouth daily.     MAGNESIUM PO Take 1 tablet by mouth daily. Magnesium taurate     Melatonin 5 MG CAPS Take by mouth at bedtime.     Misc Natural Products (COLON HERBAL CLEANSER PO) Take 1  capsule by mouth as needed (for bowel movement).     Multiple Vitamin (MULTIVITAMIN WITH MINERALS) TABS tablet Take 1 tablet by mouth daily.     nitroGLYCERIN (NITROSTAT) 0.4 MG SL tablet Place 1 tablet (0.4 mg total) under the tongue every 5 (five) minutes as needed for chest pain. 25 tablet 3   OVER THE COUNTER MEDICATION Take 1 capsule by mouth daily. Catazyme.     No current facility-administered medications for this visit.     Family History    Family History  Adopted: Yes   is adopted.   Social History    Social History   Socioeconomic History   Marital status: Married    Spouse name: Not on file   Number of children: Not on file   Years of education: Not on file   Highest  education level: Not on file  Occupational History   Not on file  Tobacco Use   Smoking status: Never   Smokeless tobacco: Never  Substance and Sexual Activity   Alcohol use: Yes    Comment: 5-7 times a month   Drug use: No   Sexual activity: Not on file  Other Topics Concern   Not on file  Social History Narrative   Not on file   Social Determinants of Health   Financial Resource Strain: Not on file  Food Insecurity: Not on file  Transportation Needs: Not on file  Physical Activity: Not on file  Stress: Not on file  Social Connections: Not on file  Intimate Partner Violence: Not on file     Review of Systems    General:  No chills, fever, night sweats or weight changes.  Cardiovascular: Positive for chest tightness, dyspnea on exertion, edema, orthopnea, palpitations, paroxysmal nocturnal dyspnea. Dermatological: No rash, lesions/masses Respiratory: No cough, dyspnea positive for wheezing Urologic: No hematuria, dysuria Abdominal:   No nausea, vomiting, diarrhea, bright red blood per rectum, melena, or hematemesis Neurologic:  No visual changes, wkns, changes in mental status. All other systems reviewed and are otherwise negative except as noted above.     Physical Exam    VS:  BP 122/72   Pulse 68   Ht 5' 6.5" (1.689 m)   Wt 237 lb (107.5 kg)   LMP 04/22/2013   SpO2 97%   BMI 37.68 kg/m  , BMI Body mass index is 37.68 kg/m.     GEN: Well nourished, well developed, in no acute distress. HEENT: normal. Neck: Supple, no JVD, carotid bruits, or masses. Cardiac: RRR, no murmurs, rubs, or gallops. No clubbing, cyanosis, edema.  Radials/DP/PT 2+ and equal bilaterally.  Respiratory:  Respirations regular and unlabored, clear to auscultation bilaterally. GI: Soft, nontender, nondistended, BS + x 4. MS: no deformity or atrophy. Skin: warm and dry, no rash. Neuro:  Strength and sensation are intact. Psych: Normal affect.  Accessory Clinical Findings    ECG  personally reviewed by me today-normal sinus rhythm heart rate of 68 bpm, nonspecific ST or abnormality- No acute changes  Lab Results  Component Value Date   WBC 5.0 05/22/2022   HGB 13.8 05/22/2022   HCT 41.5 05/22/2022   MCV 91.3 05/22/2022   PLT 221.0 05/22/2022   Lab Results  Component Value Date   CREATININE 0.74 05/22/2022   BUN 11 05/22/2022   NA 140 05/22/2022   K 4.2 05/22/2022   CL 104 05/22/2022   CO2 30 05/22/2022   Lab Results  Component Value Date   ALT 11 05/22/2022  AST 13 05/22/2022   ALKPHOS 54 05/22/2022   BILITOT 0.5 05/22/2022   Lab Results  Component Value Date   CHOL 167 10/25/2021   HDL 64 10/25/2021   LDLCALC 93 10/25/2021   TRIG 50 10/25/2021   CHOLHDL 2.6 10/25/2021    Lab Results  Component Value Date   HGBA1C 5.8 05/22/2022    Review of Prior Studies:  Coronary CTA 08/31/2020  1. Coronary calcium score of 0. This was 0 percentile for age and sex matched control.   2. Normal coronary origin with right dominance.   3. No evidence of CAD.  Echocardiogram 08/21/2020 1. Left ventricular ejection fraction, by estimation, is 60 to 65%. The  left ventricle has normal function. The left ventricle has no regional  wall motion abnormalities. There is mild concentric left ventricular  hypertrophy. Left ventricular diastolic  parameters are consistent with Grade I diastolic dysfunction (impaired  relaxation). The average left ventricular global longitudinal strain is  -9.3 %. The global longitudinal strain is abnormal.   2. Right ventricular systolic function is normal. The right ventricular  size is normal. There is normal pulmonary artery systolic pressure.   3. The mitral valve is normal in structure. Trivial mitral valve  regurgitation. No evidence of mitral stenosis.   4. The aortic valve is normal in structure. Aortic valve regurgitation is  not visualized. No aortic stenosis is present.   5. There is mild dilatation of the  ascending aorta, measuring 36 mm.   6. The inferior vena cava is normal in size with greater than 50%  respiratory variability, suggesting right atrial pressure of 3 mmHg.   Assessment & Plan   1.  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  I will begin her on very low-dose bisoprolol 2.5 mg at at bedtime as needed, hopefully this will not cause the same side effects.  Can always titrate up the dose if she continues to have the rapid heart rhythm.  I am also going to evaluate her OSA.  She thinks her primary care may be planning this and I will wait on her email concerning whether or not this is already scheduled.  Her husband who is with her states that she snores loudly, but also he does to so he is not aware of any apneic episodes. STOP BANG Score 4-5.   She also states that when she awakens at night she sometimes has rapid thoughts and has trouble going back to sleep.  I am wondering if sleep apnea is contributing to this and daytime lethargy with palpitations.  2. Asthma: Followed by PCP.   Current medicines are reviewed at length with the patient today.  I have spent 25 min's  dedicated to the care of this patient on the date of this encounter to include pre-visit review of records, assessment, management and diagnostic testing,with shared decision making. Signed, Phill Myron. West Pugh, ANP, AACC   01/02/2023 4:05 PM      Office [phone] Fax [phone]  Notice: This dictation was prepared with Dragon dictation along with smaller phrase technology. Any transcriptional errors that result from this process are unintentional and may not be corrected upon review.

## 2023-01-02 ENCOUNTER — Ambulatory Visit: Payer: BC Managed Care – PPO | Attending: Adult Health | Admitting: Adult Health

## 2023-01-02 ENCOUNTER — Encounter: Payer: Self-pay | Admitting: Adult Health

## 2023-01-02 VITALS — BP 122/72 | HR 68 | Ht 66.5 in | Wt 237.0 lb

## 2023-01-02 DIAGNOSIS — G4733 Obstructive sleep apnea (adult) (pediatric): Secondary | ICD-10-CM

## 2023-01-02 DIAGNOSIS — Z6838 Body mass index (BMI) 38.0-38.9, adult: Secondary | ICD-10-CM | POA: Diagnosis not present

## 2023-01-02 DIAGNOSIS — I4719 Other supraventricular tachycardia: Secondary | ICD-10-CM | POA: Diagnosis not present

## 2023-01-02 MED ORDER — BISOPROLOL FUMARATE 5 MG PO TABS: 2.5000 mg | ORAL_TABLET | ORAL | 6 refills | Status: DC | PRN

## 2023-01-02 MED ORDER — BISOPROLOL FUMARATE 5 MG PO TABS: 2.5000 mg | ORAL_TABLET | Freq: Every day | ORAL | Status: DC

## 2023-01-02 NOTE — Patient Instructions (Signed)
Medication Instructions:  Stop propranolol. Start Zebeta 2.5 mg ( Take 1/2 of a 5 mg Tablet As Needed). *If you need a refill on your cardiac medications before your next appointment, please call your pharmacy*   Lab Work: No Labs If you have labs (blood work) drawn today and your tests are completely normal, you will receive your results only by: [place] (if you have MyChart) OR A paper copy in the mail If you have any lab test that is abnormal or we need to change your treatment, we will call you to review the results.   Testing/Procedures: No Testing   Follow-Up: At [facility], you and your health needs are our priority.  As part of our continuing mission to provide you with exceptional heart care, we have created designated Provider Care Teams.  These Care Teams include your primary Cardiologist (physician) and Advanced Practice Providers (APPs -  Physician Assistants and Nurse Practitioners) who all work together to provide you with the care you need, when you need it.  We recommend signing up for the patient portal called "MyChart".  Sign up information is provided on this After Visit Summary.  MyChart is used to connect with patients for Virtual Visits (Telemedicine).  Patients are able to view lab/test results, encounter notes, upcoming appointments, etc.  Non-urgent messages can be sent to your provider as well.   To learn more about what you can do with MyChart, go to [email or web address].    Your next appointment:   Keep Scheduled Appointment  Provider:   Berniece Salines, DO

## 2023-01-09 ENCOUNTER — Other Ambulatory Visit: Payer: Self-pay | Admitting: Nurse Practitioner

## 2023-01-09 DIAGNOSIS — E669 Obesity, unspecified: Secondary | ICD-10-CM

## 2023-01-09 DIAGNOSIS — R0683 Snoring: Secondary | ICD-10-CM

## 2023-01-14 ENCOUNTER — Telehealth: Payer: Self-pay | Admitting: Nurse Practitioner

## 2023-01-14 DIAGNOSIS — N8189 Other female genital prolapse: Secondary | ICD-10-CM

## 2023-01-14 NOTE — Telephone Encounter (Signed)
[facility] called stating they received the referral for the patient back in August but the patient is just now wanting to be seen by them, they are asking if a new referral can be sent in, their fax number is [phone].

## 2023-01-15 NOTE — Telephone Encounter (Signed)
Referral ordered today

## 2023-01-23 ENCOUNTER — Ambulatory Visit (INDEPENDENT_AMBULATORY_CARE_PROVIDER_SITE_OTHER): Payer: BC Managed Care – PPO | Admitting: Nurse Practitioner

## 2023-01-23 VITALS — BP 154/92 | HR 72 | Temp 98.0°F | Ht 66.5 in | Wt 234.5 lb

## 2023-01-23 DIAGNOSIS — Z23 Encounter for immunization: Secondary | ICD-10-CM

## 2023-01-23 DIAGNOSIS — Z13220 Encounter for screening for lipoid disorders: Secondary | ICD-10-CM

## 2023-01-23 DIAGNOSIS — K219 Gastro-esophageal reflux disease without esophagitis: Secondary | ICD-10-CM

## 2023-01-23 DIAGNOSIS — R7303 Prediabetes: Secondary | ICD-10-CM

## 2023-01-23 DIAGNOSIS — Z0001 Encounter for general adult medical examination with abnormal findings: Secondary | ICD-10-CM | POA: Diagnosis not present

## 2023-01-23 DIAGNOSIS — Z136 Encounter for screening for cardiovascular disorders: Secondary | ICD-10-CM

## 2023-01-23 DIAGNOSIS — Z6837 Body mass index (BMI) 37.0-37.9, adult: Secondary | ICD-10-CM

## 2023-01-23 DIAGNOSIS — E669 Obesity, unspecified: Secondary | ICD-10-CM | POA: Diagnosis not present

## 2023-01-23 DIAGNOSIS — E66812 Obesity, class 2: Secondary | ICD-10-CM | POA: Insufficient documentation

## 2023-01-23 LAB — COMPREHENSIVE METABOLIC PANEL
ALT: 12 U/L (ref 0–35)
AST: 14 U/L (ref 0–37)
Albumin: 4.1 g/dL (ref 3.5–5.2)
Alkaline Phosphatase: 51 U/L (ref 39–117)
BUN: 11 mg/dL (ref 6–23)
CO2: 29 mEq/L (ref 19–32)
Calcium: 9.6 mg/dL (ref 8.4–10.5)
Chloride: 102 mEq/L (ref 96–112)
Creatinine, Ser: 0.68 mg/dL (ref 0.40–1.20)
GFR: 94.79 mL/min (ref >60.00)
Glucose, Bld: 75 mg/dL (ref 70–99)
Potassium: 4 mEq/L (ref 3.5–5.1)
Sodium: 140 mEq/L (ref 135–145)
Total Bilirubin: 0.5 mg/dL (ref 0.2–1.2)
Total Protein: 6.8 g/dL (ref 6.0–8.3)

## 2023-01-23 LAB — LIPID PANEL
Cholesterol: 174 mg/dL (ref 0–200)
HDL: 65.8 mg/dL (ref >39.00)
LDL Cholesterol: 92 mg/dL (ref 0–99)
NonHDL: 108.4
Total CHOL/HDL Ratio: 3
Triglycerides: 84 mg/dL (ref 0.0–149.0)
VLDL: 16.8 mg/dL (ref 0.0–40.0)

## 2023-01-23 LAB — TSH: TSH: 1.38 u[IU]/mL (ref 0.35–5.50)

## 2023-01-23 LAB — HEMOGLOBIN A1C: Hgb A1c MFr Bld: 6 % (ref 4.6–6.5)

## 2023-01-23 LAB — CBC
HCT: 42.4 % (ref 36.0–46.0)
Hemoglobin: 14.2 g/dL (ref 12.0–15.0)
MCHC: 33.6 g/dL (ref 30.0–36.0)
MCV: 90.6 fl (ref 78.0–100.0)
Platelets: 241 10*3/uL (ref 150.0–400.0)
RBC: 4.68 Mil/uL (ref 3.87–5.11)
RDW: 13.9 % (ref 11.5–15.5)
WBC: 5.8 10*3/uL (ref 4.0–10.5)

## 2023-01-23 NOTE — Assessment & Plan Note (Signed)
Labs ordered today, Further recommendations may be made based on these results.

## 2023-01-23 NOTE — Assessment & Plan Note (Signed)
Chronic, discussed risk/benefit of long term use of PPIs. Recommend 6 weeks of daily nexium and as needed pepcid. Recommended following up with GI to consider undergoing endoscopy when she does her colonoscopy.

## 2023-01-23 NOTE — Progress Notes (Signed)
Established Patient Office Visit  Subjective   Patient ID: Kelly Good, female    DOB: 12-01-1960  Age: 62 y.o. MRN: [id number]  Chief Complaint  Patient presents with   Annual Exam    Here for CPE. Is planning on scheduling colonoscopy, mammogram, pap smear in the upcoming months. Due for TDAP, declines flu, plans on getting Covid vaccine with pharmacy.  Has heartburn wants to discuss Nexium and Pepcid use.     Review of Systems  Constitutional:  Negative for malaise/fatigue and weight loss.  Respiratory:  Negative for shortness of breath and wheezing.   Cardiovascular:  Positive for palpitations (following with cardiology for this). Negative for chest pain.  Gastrointestinal:  Positive for heartburn.      Objective:     BP (!) 154/92   Pulse 72   Temp 98 F (36.7 C) (Temporal)   Ht 5' 6.5" (1.689 m)   Wt 234 lb 8 oz (106.4 kg)   LMP 04/22/2013   SpO2 98%   BMI 37.28 kg/m  BP Readings from Last 3 Encounters:  01/23/23 (!) 154/92  01/02/23 122/72  08/13/22 124/82   Wt Readings from Last 3 Encounters:  01/23/23 234 lb 8 oz (106.4 kg)  01/02/23 237 lb (107.5 kg)  08/13/22 222 lb 3.2 oz (100.8 kg)      Physical Exam Vitals reviewed.  Constitutional:      Appearance: Normal appearance.  HENT:     Head: Normocephalic and atraumatic.     Right Ear: Tympanic membrane, ear canal and external ear normal.     Left Ear: Tympanic membrane, ear canal and external ear normal.  Eyes:     General:        Right eye: No discharge.        Left eye: No discharge.     Extraocular Movements: Extraocular movements intact.     Conjunctiva/sclera: Conjunctivae normal.     Pupils: Pupils are equal, round, and reactive to light.  Neck:     Vascular: No carotid bruit.  Cardiovascular:     Rate and Rhythm: Normal rate and regular rhythm.     Pulses: Normal pulses.     Heart sounds: Normal heart sounds. No murmur heard. Pulmonary:     Effort: Pulmonary effort is normal.      Breath sounds: Normal breath sounds.  Chest:  Breasts:    Breasts are symmetrical.     Right: Normal.     Left: Normal.  Abdominal:     General: Abdomen is flat. Bowel sounds are normal. There is no distension.     Palpations: Abdomen is soft. There is no mass.     Tenderness: There is no abdominal tenderness.  Musculoskeletal:        General: No tenderness.     Cervical back: Neck supple. No muscular tenderness.     Right lower leg: No edema.     Left lower leg: No edema.  Lymphadenopathy:     Cervical: No cervical adenopathy.     Upper Body:     Right upper body: No supraclavicular adenopathy.     Left upper body: No supraclavicular adenopathy.  Skin:    General: Skin is warm and dry.  Neurological:     General: No focal deficit present.     Mental Status: She is alert and oriented to person, place, and time.     Motor: No weakness.     Gait: Gait normal.  Psychiatric:  Mood and Affect: Mood normal.        Behavior: Behavior normal.        Judgment: Judgment normal.      No results found for any visits on 01/23/23.    The 10-year ASCVD risk score (Arnett DK, et al., 2019) is: 3.7%    Assessment & Plan:  Encouraged/educated patient on importance of adhering to regular screening guidelines. Patient reports understanding and plans on getting colonoscopy, pap smear, and mammogram scheduled. Tdap given today. Patient encouraged to continue following healthy diet.  Problem List Items Addressed This Visit       Digestive   Gastroesophageal reflux disease    Chronic, discussed risk/benefit of long term use of PPIs. Recommend 6 weeks of daily nexium and as needed pepcid. Recommended following up with GI to consider undergoing endoscopy when she does her colonoscopy.         Other   Prediabetes    Labs ordered today, Further recommendations may be made based on these results.       Relevant Orders   TSH   Hemoglobin A1c   Lipid panel   Comprehensive  metabolic panel   CBC   Encounter for lipid screening for cardiovascular disease - Primary    Labs ordered today, Further recommendations may be made based on these results.       Relevant Orders   TSH   Hemoglobin A1c   Lipid panel   Comprehensive metabolic panel   CBC   Class 2 obesity with body mass index (BMI) of 37.0 to 37.9 in adult    Labs ordered today, Further recommendations may be made based on these results.       Relevant Orders   TSH   Hemoglobin A1c   Lipid panel   Comprehensive metabolic panel   CBC  In addition to annual physical I also performed an office visit as detailed above.   Return in about 6 months (around 07/26/2023) for f/u with Judson Roch.    Ailene Ards, NP

## 2023-01-29 ENCOUNTER — Encounter: Payer: Self-pay | Admitting: Nurse Practitioner

## 2023-02-11 ENCOUNTER — Ambulatory Visit: Payer: BC Managed Care – PPO | Attending: Cardiology | Admitting: Cardiology

## 2023-02-11 VITALS — BP 150/92 | HR 86 | Ht 66.5 in | Wt 233.8 lb

## 2023-02-11 DIAGNOSIS — I10 Essential (primary) hypertension: Secondary | ICD-10-CM

## 2023-02-11 DIAGNOSIS — E669 Obesity, unspecified: Secondary | ICD-10-CM

## 2023-02-11 DIAGNOSIS — I4719 Other supraventricular tachycardia: Secondary | ICD-10-CM | POA: Diagnosis not present

## 2023-02-11 DIAGNOSIS — R002 Palpitations: Secondary | ICD-10-CM

## 2023-02-11 NOTE — Patient Instructions (Signed)
Medication Instructions:  Your physician recommends that you continue on your current medications as directed. Please refer to the Current Medication list given to you today.   Please take your blood pressure daily for 2 weeks and send in a MyChart message. Please include heart rates.   HOW TO TAKE YOUR BLOOD PRESSURE: Rest 5 minutes before taking your blood pressure. Don't smoke or drink caffeinated beverages for at least 30 minutes before. Take your blood pressure before (not after) you eat. Sit comfortably with your back supported and both feet on the floor (don't cross your legs). Elevate your arm to heart level on a table or a desk. Use the proper sized cuff. It should fit smoothly and snugly around your bare upper arm. There should be enough room to slip a fingertip under the cuff. The bottom edge of the cuff should be 1 inch above the crease of the elbow. Ideally, take 3 measurements at one sitting and record the average.  *If you need a refill on your cardiac medications before your next appointment, please call your pharmacy*   Lab Work: None   Testing/Procedures: None   Follow-Up: At [facility], you and your health needs are our priority.  As part of our continuing mission to provide you with exceptional heart care, we have created designated Provider Care Teams.  These Care Teams include your primary Cardiologist (physician) and Advanced Practice Providers (APPs -  Physician Assistants and Nurse Practitioners) who all work together to provide you with the care you need, when you need it.   Your next appointment:   6 month(s)  Provider:   Berniece Salines, DO

## 2023-02-15 NOTE — Progress Notes (Signed)
Cardiology Office Note:    Date:  02/15/2023   ID:  Kelly Good, DOB [birth year], MRN [id number]  PCP:  Ailene Ards, NP  Cardiologist:  Berniece Salines, DO  Electrophysiologist:  None   Referring MD: Ailene Ards, NP   " I am    History of Present Illness:    Kelly Good is a 62 y.o. female with a hx of vitamin D deficiency, endometriosis is here today for follow-up visit.  Did see the patient back in September 2021 at that time due to her symptoms we order multiple testing including Zio patch, echocardiogram and a coronary CTA.   I saw the patient on December 2,2022 at that time we discussed her testing result.  I also gave the patient metoprolol as needed for palpitations.  At her visit on 9/20/203 she was experiencing she was palpitation I started her on propanolol - since her visit she did not tolerate the propanolol. She followed with Kelly Sims, NP and was started on low dose bisoprolol.   No complaints today.    Past Medical History:  Diagnosis Date   Allergy 1968   Asthma   Altered mental status, unspecified    Anemia 1980   Low iron   Anxiety 2020   Asthma 1967/2006   Child asthma then adult onset asthma   BMI 38.0-38.9,adult    Cancer ([place]) 2014   Uterine   Depression 1992   Postpartum   Diabetes mellitus without complication ([place]) 99991111   Gestational   Diarrhea, unspecified    Endometriosis    GERD (gastroesophageal reflux disease) 2019   Other chest pain    Palpitations    Sleep apnea 2008?   Snoring/ gadping   Sneezing    Vitamin D deficiency, unspecified     Past Surgical History:  Procedure Laterality Date   ABDOMINAL HYSTERECTOMY  2014   ABDOMINAL SURGERY     laporoscopic   CESAREAN SECTION      Current Medications: No outpatient medications have been marked as taking for the 02/11/23 encounter (Office Visit) with Berniece Salines, DO.     Allergies:   Codeine, Doxycycline, Propranolol, Pseudoephedrine hcl, Latex, Other, Tomato,  Amoxicillin, Casein, Food, Gluten meal, Lactose intolerance (gi), Percocet [oxycodone-acetaminophen], Polysporin [bacitracin-polymyxin b], Sudafed [pseudoephedrine hcl], and Tilactase   Social History   Socioeconomic History   Marital status: Married    Spouse name: Not on file   Number of children: Not on file   Years of education: Not on file   Highest education level: Not on file  Occupational History   Not on file  Tobacco Use   Smoking status: Never   Smokeless tobacco: Never  Substance and Sexual Activity   Alcohol use: Yes    Alcohol/week: 3.0 standard drinks of alcohol    Types: 2 Glasses of wine, 1 Cans of beer per week    Comment: Varies 0-5.   Drug use: No   Sexual activity: Yes    Birth control/protection: Surgical  Other Topics Concern   Not on file  Social History Narrative   Not on file   Social Determinants of Health   Financial Resource Strain: Not on file  Food Insecurity: Not on file  Transportation Needs: Not on file  Physical Activity: Not on file  Stress: Not on file  Social Connections: Not on file     Family History: The patient's family history is not on file. She was adopted.  ROS:   Review of Systems  Constitution: Negative for decreased appetite, fever and weight gain.  HENT: Negative for congestion, ear discharge, hoarse voice and sore throat.   Eyes: Negative for discharge, redness, vision loss in right eye and visual halos.  Cardiovascular: Negative for chest pain, dyspnea on exertion, leg swelling, orthopnea and palpitations.  Respiratory: Negative for cough, hemoptysis, shortness of breath and snoring.   Endocrine: Negative for heat intolerance and polyphagia.  Hematologic/Lymphatic: Negative for bleeding problem. Does not bruise/bleed easily.  Skin: Negative for flushing, nail changes, rash and suspicious lesions.  Musculoskeletal: Negative for arthritis, joint pain, muscle cramps, myalgias, neck pain and stiffness.   Gastrointestinal: Negative for abdominal pain, bowel incontinence, diarrhea and excessive appetite.  Genitourinary: Negative for decreased libido, genital sores and incomplete emptying.  Neurological: Negative for brief paralysis, focal weakness, headaches and loss of balance.  Psychiatric/Behavioral: Negative for altered mental status, depression and suicidal ideas.  Allergic/Immunologic: Negative for HIV exposure and persistent infections.    EKGs/Labs/Other Studies Reviewed:    The following studies were reviewed today:   EKG:  The ekg ordered today demonstrates sinus rhythm, heart rate 61 bpm   CXR 10/11/2021 TECHNIQUE: XR CHEST PA AND LATERAL Over read interpretation provided.  Primary interpretation provided by on-site provider referenced in electronic medical record at time of over read. 3 films, 2 views.   PRIMARY INTERPRETATION:. AP and lateral views of the chest obtained.   No acute focal infiltrate is identified. History of shortness of breath after recent diagnosis of COVID-19. Results viewed in EMR/EPIC:  Primary read performed by TOD R WARD     AGREE/DISAGREE: Agree with primary interpretation   ADDITIONAL FINDINGS:Tortuous thoracic aorta. Mild probable atelectasis at the left lung base.   Note:  Discordant or unexpected findings sent via [place] to provider and will be reconciled by on-site provider on receipt of radiologist over read.   Electronically Signed by: Elon Alas on 10/14/2021 11:15 AM Procedure Note  Markwalter, Sharlet Salina, MD - 10/14/2021  Formatting of this note might be different from the original.  TECHNIQUE: XR CHEST PA AND LATERAL Over read interpretation provided.  Primary interpretation provided by on-site provider referenced in electronic medical record at time of over read. 3 films, 2 views.   PRIMARY INTERPRETATION:. AP and lateral views of the chest obtained.   No acute focal infiltrate is identified. History of shortness of  breath after recent diagnosis of COVID-19. Results viewed in EMR/EPIC:  Primary read performed by TOD R WARD     AGREE/DISAGREE: Agree with primary interpretation   ADDITIONAL FINDINGS:Tortuous thoracic aorta. Mild probable atelectasis at the left lung base.   Note:  Discordant or unexpected findings sent via [place] to provider and will be reconciled by on-site provider on receipt of radiologist over read.   Electronically Signed by: Elon Alas on 10/14/2021 11:15 AM    08/21/2020 IMPRESSIONS     1. Left ventricular ejection fraction, by estimation, is 60 to 65%. The  left ventricle has normal function. The left ventricle has no regional  wall motion abnormalities. There is mild concentric left ventricular  hypertrophy. Left ventricular diastolic  parameters are consistent with Grade I diastolic dysfunction (impaired  relaxation). The average left ventricular global longitudinal strain is  -9.3 %. The global longitudinal strain is abnormal.   2. Right ventricular systolic function is normal. The right ventricular  size is normal. There is normal pulmonary artery systolic pressure.   3. The mitral valve is normal in structure. Trivial mitral  valve  regurgitation. No evidence of mitral stenosis.   4. The aortic valve is normal in structure. Aortic valve regurgitation is  not visualized. No aortic stenosis is present.   5. There is mild dilatation of the ascending aorta, measuring 36 mm.   6. The inferior vena cava is normal in size with greater than 50%  respiratory variability, suggesting right atrial pressure of 3 mmHg.   Comparison(s): No prior Echocardiogram.   FINDINGS   Left Ventricle: Left ventricular ejection fraction, by estimation, is 60  to 65%. The left ventricle has normal function. The left ventricle has no  regional wall motion abnormalities. The average left ventricular global  longitudinal strain is -9.3 %. The   global longitudinal strain is  abnormal. The left ventricular internal  cavity size was normal in size. There is mild concentric left ventricular  hypertrophy. Left ventricular diastolic parameters are consistent with  Grade I diastolic dysfunction  (impaired relaxation).   Right Ventricle: The right ventricular size is normal. No increase in  right ventricular wall thickness. Right ventricular systolic function is  normal. There is normal pulmonary artery systolic pressure. The tricuspid  regurgitant velocity is 2.23 m/s, and   with an assumed right atrial pressure of 3 mmHg, the estimated right  ventricular systolic pressure is [id number] mmHg.   Left Atrium: Left atrial size was normal in size.   Right Atrium: Right atrial size was normal in size. Prominent Eustachian  valve.   Pericardium: There is no evidence of pericardial effusion.   Mitral Valve: The mitral valve is normal in structure. Trivial mitral  valve regurgitation. No evidence of mitral valve stenosis.   Tricuspid Valve: The tricuspid valve is normal in structure. Tricuspid  valve regurgitation is not demonstrated. No evidence of tricuspid  stenosis.   Aortic Valve: The aortic valve is normal in structure. Aortic valve  regurgitation is not visualized. No aortic stenosis is present.   Pulmonic Valve: The pulmonic valve was normal in structure. Pulmonic valve  regurgitation is not visualized. No evidence of pulmonic stenosis.   Aorta: Aortic dilatation noted. There is mild dilatation of the ascending  aorta, measuring 36 mm.   Venous: The inferior vena cava is normal in size with greater than 50%  respiratory variability, suggesting right atrial pressure of 3 mmHg.   IAS/Shunts: No atrial level shunt detected by color flow Doppler.       Recent Labs: 01/23/2023: ALT 12; BUN 11; Creatinine, Ser 0.68; Hemoglobin 14.2; Platelets 241.0; Potassium 4.0; Sodium 140; TSH 1.38  Recent Lipid Panel    Component Value Date/Time   CHOL 174 01/23/2023 1152    CHOL 167 10/25/2021 1048   TRIG 84.0 01/23/2023 1152   HDL 65.80 01/23/2023 1152   HDL 64 10/25/2021 1048   CHOLHDL 3 01/23/2023 1152   VLDL 16.8 01/23/2023 1152   LDLCALC 92 01/23/2023 1152   LDLCALC 93 10/25/2021 1048    Physical Exam:    VS:  BP (!) 150/92   Pulse 86   Ht 5' 6.5" (1.689 m)   Wt 233 lb 12.8 oz (106.1 kg)   LMP 04/22/2013   BMI 37.17 kg/m     Wt Readings from Last 3 Encounters:  02/11/23 233 lb 12.8 oz (106.1 kg)  01/23/23 234 lb 8 oz (106.4 kg)  01/02/23 237 lb (107.5 kg)     GEN: Well nourished, well developed in no acute distress HEENT: Normal NECK: No JVD; No carotid bruits LYMPHATICS: No lymphadenopathy CARDIAC: S1S2 noted,RRR,  no murmurs, rubs, gallops RESPIRATORY:  Clear to auscultation without rales, wheezing or rhonchi  ABDOMEN: Soft, non-tender, non-distended, +bowel sounds, no guarding. EXTREMITIES: No edema, No cyanosis, no clubbing MUSCULOSKELETAL:  No deformity  SKIN: Warm and dry NEUROLOGIC:  Alert and oriented x 3, non-focal PSYCHIATRIC:  Normal affect, good insight  ASSESSMENT:    1. PAT (paroxysmal atrial tachycardia)   2. Palpitations   3. Obesity (BMI 30-39.9)   4. Primary hypertension      PLAN:    She has had some improvement with the bisoprolol.  Her blood pressure in the office today slightly elevated this is an isolated reading.  Repeat at the end of the visit was 132/88 mmHg.  So for now I will have her take her blood pressure daily and send me updates in 2 weeks. The patient understands the need to lose weight with diet and exercise. We have discussed specific strategies for this.   The patient is in agreement with the above plan. The patient left the office in stable condition.  The patient will follow up in 6 months.   Medication Adjustments/Labs and Tests Ordered: Current medicines are reviewed at length with the patient today.  Concerns regarding medicines are outlined above.  No orders of the defined types  were placed in this encounter.  No orders of the defined types were placed in this encounter.    Patient Instructions  Medication Instructions:  Your physician recommends that you continue on your current medications as directed. Please refer to the Current Medication list given to you today.   Please take your blood pressure daily for 2 weeks and send in a MyChart message. Please include heart rates.   HOW TO TAKE YOUR BLOOD PRESSURE: Rest 5 minutes before taking your blood pressure. Don't smoke or drink caffeinated beverages for at least 30 minutes before. Take your blood pressure before (not after) you eat. Sit comfortably with your back supported and both feet on the floor (don't cross your legs). Elevate your arm to heart level on a table or a desk. Use the proper sized cuff. It should fit smoothly and snugly around your bare upper arm. There should be enough room to slip a fingertip under the cuff. The bottom edge of the cuff should be 1 inch above the crease of the elbow. Ideally, take 3 measurements at one sitting and record the average.  *If you need a refill on your cardiac medications before your next appointment, please call your pharmacy*   Lab Work: None   Testing/Procedures: None   Follow-Up: At [facility], you and your health needs are our priority.  As part of our continuing mission to provide you with exceptional heart care, we have created designated Provider Care Teams.  These Care Teams include your primary Cardiologist (physician) and Advanced Practice Providers (APPs -  Physician Assistants and Nurse Practitioners) who all work together to provide you with the care you need, when you need it.   Your next appointment:   6 month(s)  Provider:   Berniece Salines, DO        Adopting a Healthy Lifestyle.  Know what a healthy weight is for you (roughly BMI <25) and aim to maintain this   Aim for 7+ servings of fruits and vegetables daily    65-80+ fluid ounces of water or unsweet tea for healthy kidneys   Limit to max 1 drink of alcohol per day; avoid smoking/tobacco   Limit animal fats in diet for  cholesterol and heart health - choose grass fed whenever available   Avoid highly processed foods, and foods high in saturated/trans fats   Aim for low stress - take time to unwind and care for your mental health   Aim for 150 min of moderate intensity exercise weekly for heart health, and weights twice weekly for bone health   Aim for 7-9 hours of sleep daily   When it comes to diets, agreement about the perfect plan isnt easy to find, even among the experts. Experts at the [employer] developed an idea known as the Healthy Eating Plate. Just imagine a plate divided into logical, healthy portions.   The emphasis is on diet quality:   Load up on vegetables and fruits - one-half of your plate: Aim for color and variety, and remember that potatoes dont count.   Go for whole grains - one-quarter of your plate: Whole wheat, barley, wheat berries, quinoa, oats, brown rice, and foods made with them. If you want pasta, go with whole wheat pasta.   Protein power - one-quarter of your plate: Fish, chicken, beans, and nuts are all healthy, versatile protein sources. Limit red meat.   The diet, however, does go beyond the plate, offering a few other suggestions.   Use healthy plant oils, such as olive, canola, soy, corn, sunflower and peanut. Check the labels, and avoid partially hydrogenated oil, which have unhealthy trans fats.   If youre thirsty, drink water. Coffee and tea are good in moderation, but skip sugary drinks and limit milk and dairy products to one or two daily servings.   The type of carbohydrate in the diet is more important than the amount. Some sources of carbohydrates, such as vegetables, fruits, whole grains, and beans-are healthier than others.   Finally, stay active  Signed, Berniece Salines, DO  02/15/2023 11:03 AM    [place]

## 2023-04-10 ENCOUNTER — Telehealth: Payer: Self-pay | Admitting: Cardiology

## 2023-04-10 NOTE — Telephone Encounter (Signed)
Pt c/o BP issue: STAT if pt c/o blurred vision, one-sided weakness or slurred speech  1. What are your last 5 BP readings?  203/114 154/90  2. Are you having any other symptoms (ex. Dizziness, headache, blurred vision, passed out)? Headache, seems off than usual  3. What is your BP issue? Pt states that BP has been elevated and would like a callback regarding this matter. Please advise

## 2023-04-10 NOTE — Telephone Encounter (Signed)
Should she still add the 1/2 tablet in the morning if needed? Once starting the 5mg  daily.

## 2023-04-10 NOTE — Telephone Encounter (Signed)
BP 1:15 Call to patient to recheck BP and was 178/98 HR 69.  She states she rested a few minutes and deep breaths and then BP 152/99 HR 63.  Advised that if her BP elevates to that again, she should reduce stressors.  Quite darker area, no stimulie, deep breaths.  If it continues to rise as the earlier one today, she should go to ed or Urgent care.  She will send her daily BP log once she gets home to be able to review with this message.

## 2023-04-10 NOTE — Telephone Encounter (Signed)
Patient states understanding and will start the 5mg  daily as directed.  She will start taking BP when has symptoms such as H/A.  Will call if any further issues
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
## 2023-05-19 ENCOUNTER — Telehealth: Payer: Self-pay | Admitting: Cardiology

## 2023-05-19 DIAGNOSIS — I10 Essential (primary) hypertension: Secondary | ICD-10-CM

## 2023-05-19 MED ORDER — BISOPROLOL FUMARATE 5 MG PO TABS: 5.0000 mg | ORAL_TABLET | Freq: Every day | ORAL | 3 refills | Status: DC

## 2023-05-19 NOTE — Telephone Encounter (Signed)
Pt c/o medication issue:  1. Name of Medication: bisoprolol (ZEBETA) 5 MG tablet   2. How are you currently taking this medication (dosage and times per day)?    3. Are you having a reaction (difficulty breathing--STAT)? no  4. What is your medication issue? Patient states that the prescription is not what was discuss. She states that she is suppose to take 5mg  by mouth daily. Patient is asking that new prescription be sent. Please advise

## 2023-05-19 NOTE — Telephone Encounter (Signed)
Patient has ran out of bisoprolol 2.5 mg because dose was increased to 5mg  daily. Sent Rx to show dose increase

## 2023-07-09 ENCOUNTER — Encounter (INDEPENDENT_AMBULATORY_CARE_PROVIDER_SITE_OTHER): Payer: Self-pay

## 2023-07-30 ENCOUNTER — Ambulatory Visit: Payer: BC Managed Care – PPO | Admitting: Nurse Practitioner

## 2023-07-30 VITALS — BP 130/84 | HR 86 | Temp 97.9°F | Ht 66.5 in | Wt 240.1 lb

## 2023-07-30 DIAGNOSIS — N8189 Other female genital prolapse: Secondary | ICD-10-CM

## 2023-07-30 DIAGNOSIS — I83813 Varicose veins of bilateral lower extremities with pain: Secondary | ICD-10-CM

## 2023-07-30 DIAGNOSIS — Z136 Encounter for screening for cardiovascular disorders: Secondary | ICD-10-CM

## 2023-07-30 DIAGNOSIS — R519 Headache, unspecified: Secondary | ICD-10-CM

## 2023-07-30 DIAGNOSIS — Z1211 Encounter for screening for malignant neoplasm of colon: Secondary | ICD-10-CM

## 2023-07-30 DIAGNOSIS — R002 Palpitations: Secondary | ICD-10-CM

## 2023-07-30 DIAGNOSIS — K219 Gastro-esophageal reflux disease without esophagitis: Secondary | ICD-10-CM

## 2023-07-30 DIAGNOSIS — Z13220 Encounter for screening for lipoid disorders: Secondary | ICD-10-CM

## 2023-07-30 NOTE — Assessment & Plan Note (Signed)
Chronic, intermittent Do sound consistent with tension or sinus headaches.  She does have some photophobia and headaches are not fully resolved with use of ibuprofen.  Additionally patient reports not wanting to use ibuprofen to frequently. Will trial Ubrelvy to see if this helps resolve headache.  2 sample tablets provided to patient today. Follow-up in 6 months or sooner as needed.

## 2023-07-30 NOTE — Assessment & Plan Note (Signed)
Follow-up with cardiology and she was encouraged to discuss possible calcium CT score with her cardiologist for screening purposes.  She reports understanding.

## 2023-07-30 NOTE — Assessment & Plan Note (Signed)
Reach out to GI to schedule colon cancer screening.  Patient reports understanding.

## 2023-07-30 NOTE — Patient Instructions (Signed)
Ask Dr. Servando Salina about calcium CT score

## 2023-07-30 NOTE — Assessment & Plan Note (Signed)
Referral to vascular surgery made today

## 2023-07-30 NOTE — Progress Notes (Signed)
Established Patient Office Visit  Subjective   Patient ID: Kelly Good, female    DOB: Jun 21, 1961  Age: 62 y.o. MRN: [id number]  Chief Complaint  Patient presents with   Headache    Headache: Has history of menstrual headaches before she had her hysterectomy.  Since then does not have headaches quite this frequently normally.  However over the last few months has noticed more pressure/tension headaches that occur at least twice a month.  She will often have to lay down due to photosensitivity related to them.  No nausea or vomiting associated with them.  Will take peppermint as needed as well as use migraine rollers which did seem to relieve symptoms slightly.  Also will take ibuprofen which helps headache, Tylenol does not provide any headache relief.   She does provide me with an update regarding her other chronic conditions that are ongoing.  She has cardiac palpitations and is followed by cardiology Dr. Servando Salina for this.  Currently on bisoprolol as she had an allergic reaction to propranolol.  Bisoprolol is helping to control palpitations as well as manage her high blood pressure well. She continues to work with gynecology to get updated Pap and mammogram.  She is also doing pelvic floor therapy. She has not had colonoscopy scheduled yet but she plans on doing this in the near future. Continues to have acid reflux.  Is planning to trial omeprazole for 2 to 4 weeks to see if this helps.  Reports that she has varicose veins of bilateral lower extremities.  They can be somewhat uncomfortable at times and she does notice swelling in her legs.  She like evaluation with vascular surgeon for this.    Review of Systems  Eyes:  Positive for blurred vision and photophobia. Negative for double vision.  Gastrointestinal:  Negative for nausea and vomiting.  Neurological:  Positive for headaches.      Objective:     BP 130/84   Pulse 86   Temp 97.9 F (36.6 C) (Temporal)   Ht 5' 6.5"  (1.689 m)   Wt 240 lb 2 oz (108.9 kg)   LMP 04/22/2013   SpO2 98%   BMI 38.18 kg/m  BP Readings from Last 3 Encounters:  07/30/23 130/84  02/11/23 (!) 150/92  01/23/23 (!) 154/92   Wt Readings from Last 3 Encounters:  07/30/23 240 lb 2 oz (108.9 kg)  02/11/23 233 lb 12.8 oz (106.1 kg)  01/23/23 234 lb 8 oz (106.4 kg)      Physical Exam Vitals reviewed.  Constitutional:      General: She is not in acute distress.    Appearance: Normal appearance.  HENT:     Head: Normocephalic and atraumatic.  Neck:     Vascular: No carotid bruit.  Cardiovascular:     Rate and Rhythm: Normal rate and regular rhythm.     Pulses: Normal pulses.     Heart sounds: Normal heart sounds.  Pulmonary:     Effort: Pulmonary effort is normal.     Breath sounds: Normal breath sounds.  Musculoskeletal:     Right lower leg: Edema present.     Left lower leg: Edema present.  Skin:    General: Skin is warm and dry.  Neurological:     General: No focal deficit present.     Mental Status: She is alert and oriented to person, place, and time.  Psychiatric:        Mood and Affect: Mood normal.  Behavior: Behavior normal.        Judgment: Judgment normal.      No results found for any visits on 07/30/23.    The 10-year ASCVD risk score (Arnett DK, et al., 2019) is: 3.6%    Assessment & Plan:   Problem List Items Addressed This Visit       Cardiovascular and Mediastinum   Varicose veins of both lower extremities with pain - Primary    Referral to vascular surgery made today      Relevant Orders   Ambulatory referral to Vascular Surgery     Digestive   Gastroesophageal reflux disease    Chronic Trial omeprazole 2 to 4 weeks, reach out if symptoms persist or worsen        Other   Palpitations    Chronic, intermittent, much improved Continue bisoprolol 5 mg/day Also encouraged her to follow-up with cardiology, Dr. Servando Salina.  She may be a good candidate for calcium CT score for  screening purposes.  She was encouraged to discuss this with Dr. Servando Salina when she sees her next time.      Pelvic floor weakness in female    Chronic, improving with pelvic floor therapy Continue to follow-up with therapy as scheduled      Colon cancer screening    Reach out to GI to schedule colon cancer screening.  Patient reports understanding.      Encounter for lipid screening for cardiovascular disease    Follow-up with cardiology and she was encouraged to discuss possible calcium CT score with her cardiologist for screening purposes.  She reports understanding.      Headache    Chronic, intermittent Do sound consistent with tension or sinus headaches.  She does have some photophobia and headaches are not fully resolved with use of ibuprofen.  Additionally patient reports not wanting to use ibuprofen to frequently. Will trial Ubrelvy to see if this helps resolve headache.  2 sample tablets provided to patient today. Follow-up in 6 months or sooner as needed.       Return in about 6 months (around 01/27/2024) for CPE with Maralyn Sago.    Elenore Paddy, NP

## 2023-07-30 NOTE — Assessment & Plan Note (Signed)
Chronic, improving with pelvic floor therapy Continue to follow-up with therapy as scheduled

## 2023-07-30 NOTE — Assessment & Plan Note (Signed)
Chronic Trial omeprazole 2 to 4 weeks, reach out if symptoms persist or worsen

## 2023-07-30 NOTE — Assessment & Plan Note (Signed)
Chronic, intermittent, much improved Continue bisoprolol 5 mg/day Also encouraged her to follow-up with cardiology, Dr. Servando Salina.  She may be a good candidate for calcium CT score for screening purposes.  She was encouraged to discuss this with Dr. Servando Salina when she sees her next time.

## 2023-08-10 ENCOUNTER — Ambulatory Visit: Payer: BC Managed Care – PPO | Attending: Cardiology | Admitting: Cardiology

## 2023-08-10 ENCOUNTER — Encounter: Payer: Self-pay | Admitting: Cardiology

## 2023-08-10 VITALS — BP 116/70 | HR 58 | Ht 66.0 in | Wt 235.6 lb

## 2023-08-10 DIAGNOSIS — I77810 Thoracic aortic ectasia: Secondary | ICD-10-CM | POA: Diagnosis not present

## 2023-08-10 DIAGNOSIS — E8881 Metabolic syndrome: Secondary | ICD-10-CM

## 2023-08-10 DIAGNOSIS — R7303 Prediabetes: Secondary | ICD-10-CM

## 2023-08-10 DIAGNOSIS — I4719 Other supraventricular tachycardia: Secondary | ICD-10-CM | POA: Diagnosis not present

## 2023-08-10 NOTE — Progress Notes (Signed)
Cardiology Office Note:    Date:  08/14/2023   ID:  Kelly Good, DOB May 19, 1961, MRN [id number]  PCP:  Elenore Paddy, NP  Cardiologist:  Thomasene Ripple, DO  Electrophysiologist:  None   Referring MD: Elenore Paddy, NP   " I am ok"   History of Present Illness:    Kelly Good is a 62 y.o. female with a hx of vitamin D deficiency, endometriosis is here today for follow-up visit.  Did see the patient back in September 2021 at that time due to her symptoms we order multiple testing including Zio patch, echocardiogram and a coronary CTA.   No complaints today.   Past Medical History:  Diagnosis Date   Allergy 1968   Asthma   Altered mental status, unspecified    Anemia 1980   Low iron   Anxiety 2020   Asthma 1967/2006   Child asthma then adult onset asthma   BMI 38.0-38.9,adult    Cancer (HCC) 2014   Uterine   Depression 1992   Postpartum   Diabetes mellitus without complication (HCC) 1991   Gestational   Diarrhea, unspecified    Endometriosis    GERD (gastroesophageal reflux disease) 2019   Other chest pain    Palpitations    Sleep apnea 2008?   Snoring/ gadping   Sneezing    Vitamin D deficiency, unspecified     Past Surgical History:  Procedure Laterality Date   ABDOMINAL HYSTERECTOMY  2014   ABDOMINAL SURGERY     laporoscopic   CESAREAN SECTION      Current Medications: Current Meds  Medication Sig   albuterol (VENTOLIN HFA) 108 (90 Base) MCG/ACT inhaler Inhale 2 puffs into the lungs every 6 (six) hours as needed (for asthma).   bisoprolol (ZEBETA) 5 MG tablet Take 1 tablet (5 mg total) by mouth daily.   diphenhydrAMINE HCl (BENADRYL ALLERGY PO) Take by mouth as needed.   EPINEPHrine 0.3 mg/0.3 mL IJ SOAJ injection Inject 0.3 mg into the muscle as needed (for allergic reaction).   erythromycin ophthalmic ointment Place 1 Application into the left eye as needed.   IBUPROFEN PO Take 200 mg by mouth.   lactobacillus acidophilus (BACID) TABS tablet Take  2 tablets by mouth daily.   MAGNESIUM PO Take 1 tablet by mouth daily. Magnesium taurate   Melatonin 5 MG CAPS Take by mouth at bedtime.   Misc Natural Products (COLON HERBAL CLEANSER PO) Take 1 capsule by mouth as needed (for bowel movement).   Multiple Vitamin (MULTIVITAMIN WITH MINERALS) TABS tablet Take 1 tablet by mouth daily.   nitroGLYCERIN (NITROSTAT) 0.4 MG SL tablet Place 1 tablet (0.4 mg total) under the tongue every 5 (five) minutes as needed for chest pain.   OVER THE COUNTER MEDICATION Take 1 capsule by mouth daily. Catazyme.     Allergies:   Codeine, Doxycycline, Propranolol, Pseudoephedrine hcl, Latex, Other, Tomato, Amoxicillin, Casein, Food, Gluten meal, Lactose intolerance (gi), Percocet [oxycodone-acetaminophen], Polysporin [bacitracin-polymyxin b], Sudafed [pseudoephedrine hcl], and Tilactase   Social History   Socioeconomic History   Marital status: Married    Spouse name: Not on file   Number of children: Not on file   Years of education: Not on file   Highest education level: Not on file  Occupational History   Not on file  Tobacco Use   Smoking status: Never   Smokeless tobacco: Never  Substance and Sexual Activity   Alcohol use: Yes    Alcohol/week: 3.0 standard drinks of alcohol  Types: 2 Glasses of wine, 1 Cans of beer per week    Comment: Varies 0-5.   Drug use: No   Sexual activity: Yes    Birth control/protection: Surgical  Other Topics Concern   Not on file  Social History Narrative   Not on file   Social Determinants of Health   Financial Resource Strain: Not on file  Food Insecurity: No Food Insecurity (07/15/2021)   Received from [facility]   Hunger Vital Sign    Worried About Running Out of Food in the Last Year: Never true    Ran Out of Food in the Last Year: Never true  Transportation Needs: Not on file  Physical Activity: Not on file  Stress: Not on file  Social Connections: Unknown (04/07/2022)   Received from  [facility], [facility]   Social Network    Social Network: Not on file     Family History: The patient's family history is not on file. She was adopted.  ROS:   Review of Systems  Constitution: Negative for decreased appetite, fever and weight gain.  HENT: Negative for congestion, ear discharge, hoarse voice and sore throat.   Eyes: Negative for discharge, redness, vision loss in right eye and visual halos.  Cardiovascular: Negative for chest pain, dyspnea on exertion, leg swelling, orthopnea and palpitations.  Respiratory: Negative for cough, hemoptysis, shortness of breath and snoring.   Endocrine: Negative for heat intolerance and polyphagia.  Hematologic/Lymphatic: Negative for bleeding problem. Does not bruise/bleed easily.  Skin: Negative for flushing, nail changes, rash and suspicious lesions.  Musculoskeletal: Negative for arthritis, joint pain, muscle cramps, myalgias, neck pain and stiffness.  Gastrointestinal: Negative for abdominal pain, bowel incontinence, diarrhea and excessive appetite.  Genitourinary: Negative for decreased libido, genital sores and incomplete emptying.  Neurological: Negative for brief paralysis, focal weakness, headaches and loss of balance.  Psychiatric/Behavioral: Negative for altered mental status, depression and suicidal ideas.  Allergic/Immunologic: Negative for HIV exposure and persistent infections.    EKGs/Labs/Other Studies Reviewed:    The following studies were reviewed today:   EKG:  The ekg ordered today demonstrates sinus rhythm, heart rate 61 bpm   CXR 10/11/2021 TECHNIQUE: XR CHEST PA AND LATERAL Over read interpretation provided.  Primary interpretation provided by on-site provider referenced in electronic medical record at time of over read. 3 films, 2 views.   PRIMARY INTERPRETATION:. AP and lateral views of the chest obtained.   No acute focal infiltrate is identified. History of shortness of breath after recent  diagnosis of COVID-19. Results viewed in EMR/EPIC:  Primary read performed by TOD R WARD     AGREE/DISAGREE: Agree with primary interpretation   ADDITIONAL FINDINGS:Tortuous thoracic aorta. Mild probable atelectasis at the left lung base.   Note:  Discordant or unexpected findings sent via EPIC Inbox Message to provider and will be reconciled by on-site provider on receipt of radiologist over read.   Electronically Signed by: Charolett Bumpers on 10/14/2021 11:15 AM Procedure Note  Markwalter, Rosezena Sensor, MD - 10/14/2021  Formatting of this note might be different from the original.  TECHNIQUE: XR CHEST PA AND LATERAL Over read interpretation provided.  Primary interpretation provided by on-site provider referenced in electronic medical record at time of over read. 3 films, 2 views.   PRIMARY INTERPRETATION:. AP and lateral views of the chest obtained.   No acute focal infiltrate is identified. History of shortness of breath after recent diagnosis of COVID-19. Results viewed in EMR/EPIC:  Primary  read performed by TOD R WARD     AGREE/DISAGREE: Agree with primary interpretation   ADDITIONAL FINDINGS:Tortuous thoracic aorta. Mild probable atelectasis at the left lung base.   Note:  Discordant or unexpected findings sent via EPIC Inbox Message to provider and will be reconciled by on-site provider on receipt of radiologist over read.   Electronically Signed by: Charolett Bumpers on 10/14/2021 11:15 AM    08/21/2020 IMPRESSIONS   1. Left ventricular ejection fraction, by estimation, is 60 to 65%. The  left ventricle has normal function. The left ventricle has no regional  wall motion abnormalities. There is mild concentric left ventricular  hypertrophy. Left ventricular diastolic  parameters are consistent with Grade I diastolic dysfunction (impaired  relaxation). The average left ventricular global longitudinal strain is  -9.3 %. The global longitudinal strain is abnormal.   2.  Right ventricular systolic function is normal. The right ventricular  size is normal. There is normal pulmonary artery systolic pressure.   3. The mitral valve is normal in structure. Trivial mitral valve  regurgitation. No evidence of mitral stenosis.   4. The aortic valve is normal in structure. Aortic valve regurgitation is  not visualized. No aortic stenosis is present.   5. There is mild dilatation of the ascending aorta, measuring 36 mm.   6. The inferior vena cava is normal in size with greater than 50%  respiratory variability, suggesting right atrial pressure of 3 mmHg.   Comparison(s): No prior Echocardiogram.   FINDINGS   Left Ventricle: Left ventricular ejection fraction, by estimation, is 60  to 65%. The left ventricle has normal function. The left ventricle has no  regional wall motion abnormalities. The average left ventricular global  longitudinal strain is -9.3 %. The   global longitudinal strain is abnormal. The left ventricular internal  cavity size was normal in size. There is mild concentric left ventricular  hypertrophy. Left ventricular diastolic parameters are consistent with  Grade I diastolic dysfunction  (impaired relaxation).   Right Ventricle: The right ventricular size is normal. No increase in  right ventricular wall thickness. Right ventricular systolic function is  normal. There is normal pulmonary artery systolic pressure. The tricuspid  regurgitant velocity is 2.23 m/s, and   with an assumed right atrial pressure of 3 mmHg, the estimated right  ventricular systolic pressure is 22.9 mmHg.   Left Atrium: Left atrial size was normal in size.   Right Atrium: Right atrial size was normal in size. Prominent Eustachian  valve.   Pericardium: There is no evidence of pericardial effusion.   Mitral Valve: The mitral valve is normal in structure. Trivial mitral  valve regurgitation. No evidence of mitral valve stenosis.   Tricuspid Valve: The tricuspid  valve is normal in structure. Tricuspid  valve regurgitation is not demonstrated. No evidence of tricuspid  stenosis.   Aortic Valve: The aortic valve is normal in structure. Aortic valve  regurgitation is not visualized. No aortic stenosis is present.   Pulmonic Valve: The pulmonic valve was normal in structure. Pulmonic valve  regurgitation is not visualized. No evidence of pulmonic stenosis.   Aorta: Aortic dilatation noted. There is mild dilatation of the ascending  aorta, measuring 36 mm.   Venous: The inferior vena cava is normal in size with greater than 50%  respiratory variability, suggesting right atrial pressure of 3 mmHg.   IAS/Shunts: No atrial level shunt detected by color flow Doppler.    Recent Labs: 01/23/2023: ALT 12; BUN 11; Creatinine, Ser 0.68;  Hemoglobin 14.2; Platelets 241.0; Potassium 4.0; Sodium 140; TSH 1.38  Recent Lipid Panel    Component Value Date/Time   CHOL 174 01/23/2023 1152   CHOL 167 10/25/2021 1048   TRIG 84.0 01/23/2023 1152   HDL 65.80 01/23/2023 1152   HDL 64 10/25/2021 1048   CHOLHDL 3 01/23/2023 1152   VLDL 16.8 01/23/2023 1152   LDLCALC 92 01/23/2023 1152   LDLCALC 93 10/25/2021 1048    Physical Exam:    VS:  BP 116/70 (BP Location: Left Arm, Patient Position: Sitting, Cuff Size: Normal)   Pulse (!) 58   Ht 5\' 6"  (1.676 m)   Wt 235 lb 9.6 oz (106.9 kg)   LMP 04/22/2013   SpO2 99%   BMI 38.03 kg/m     Wt Readings from Last 3 Encounters:  08/10/23 235 lb 9.6 oz (106.9 kg)  07/30/23 240 lb 2 oz (108.9 kg)  02/11/23 233 lb 12.8 oz (106.1 kg)     GEN: Well nourished, well developed in no acute distress HEENT: Normal NECK: No JVD; No carotid bruits LYMPHATICS: No lymphadenopathy CARDIAC: S1S2 noted,RRR, no murmurs, rubs, gallops RESPIRATORY:  Clear to auscultation without rales, wheezing or rhonchi  ABDOMEN: Soft, non-tender, non-distended, +bowel sounds, no guarding. EXTREMITIES: No edema, No cyanosis, no  clubbing MUSCULOSKELETAL:  No deformity  SKIN: Warm and dry NEUROLOGIC:  Alert and oriented x 3, non-focal PSYCHIATRIC:  Normal affect, good insight  ASSESSMENT:    1. PAT (paroxysmal atrial tachycardia)   2. Mild ascending aorta dilatation (HCC)   3. Prediabetes   4. Metabolic syndrome   5. Morbid obesity (HCC)      PLAN:    She is doing well from a CV standpoint. Her blood pressure is at target.    The patient understands the need to lose weight with diet and exercise. We have discussed specific strategies for this.  Prediabetes is being managed by the primary team.   The patient is in agreement with the above plan. The patient left the office in stable condition.  The patient will follow up in 6 months.   Medication Adjustments/Labs and Tests Ordered: Current medicines are reviewed at length with the patient today.  Concerns regarding medicines are outlined above.  Orders Placed This Encounter  Procedures   EKG 12-Lead   No orders of the defined types were placed in this encounter.    Patient Instructions  Medication Instructions:  Your physician recommends that you continue on your current medications as directed. Please refer to the Current Medication list given to you today.  *If you need a refill on your cardiac medications before your next appointment, please call your pharmacy*   Lab Work: None   Testing/Procedures: None   Follow-Up: At [facility], you and your health needs are our priority.  As part of our continuing mission to provide you with exceptional heart care, we have created designated Provider Care Teams.  These Care Teams include your primary Cardiologist (physician) and Advanced Practice Providers (APPs -  Physician Assistants and Nurse Practitioners) who all work together to provide you with the care you need, when you need it.    Your next appointment:   1 year(s)  Provider:   Thomasene Ripple, DO     Adopting a Healthy  Lifestyle.  Know what a healthy weight is for you (roughly BMI <25) and aim to maintain this   Aim for 7+ servings of fruits and vegetables daily   65-80+ fluid ounces of  water or unsweet tea for healthy kidneys   Limit to max 1 drink of alcohol per day; avoid smoking/tobacco   Limit animal fats in diet for cholesterol and heart health - choose grass fed whenever available   Avoid highly processed foods, and foods high in saturated/trans fats   Aim for low stress - take time to unwind and care for your mental health   Aim for 150 min of moderate intensity exercise weekly for heart health, and weights twice weekly for bone health   Aim for 7-9 hours of sleep daily   When it comes to diets, agreement about the perfect plan isnt easy to find, even among the experts. Experts at the [facility] of [employer] developed an idea known as the Healthy Eating Plate. Just imagine a plate divided into logical, healthy portions.   The emphasis is on diet quality:   Load up on vegetables and fruits - one-half of your plate: Aim for color and variety, and remember that potatoes dont count.   Go for whole grains - one-quarter of your plate: Whole wheat, barley, wheat berries, quinoa, oats, brown rice, and foods made with them. If you want pasta, go with whole wheat pasta.   Protein power - one-quarter of your plate: Fish, chicken, beans, and nuts are all healthy, versatile protein sources. Limit red meat.   The diet, however, does go beyond the plate, offering a few other suggestions.   Use healthy plant oils, such as olive, canola, soy, corn, sunflower and peanut. Check the labels, and avoid partially hydrogenated oil, which have unhealthy trans fats.   If youre thirsty, drink water. Coffee and tea are good in moderation, but skip sugary drinks and limit milk and dairy products to one or two daily servings.   The type of carbohydrate in the diet is more important than the amount. Some  sources of carbohydrates, such as vegetables, fruits, whole grains, and beans-are healthier than others.   Finally, stay active  Signed, Thomasene Ripple, DO  08/14/2023 10:31 PM    [facility]

## 2023-08-10 NOTE — Patient Instructions (Signed)
Medication Instructions:  Your physician recommends that you continue on your current medications as directed. Please refer to the Current Medication list given to you today.  *If you need a refill on your cardiac medications before your next appointment, please call your pharmacy*   Lab Work: None   Testing/Procedures: None   Follow-Up: At [facility], you and your health needs are our priority.  As part of our continuing mission to provide you with exceptional heart care, we have created designated Provider Care Teams.  These Care Teams include your primary Cardiologist (physician) and Advanced Practice Providers (APPs -  Physician Assistants and Nurse Practitioners) who all work together to provide you with the care you need, when you need it.   Your next appointment:   1 year(s)  Provider:   Kardie Tobb, DO   

## 2023-09-15 ENCOUNTER — Telehealth: Payer: Self-pay | Admitting: Cardiology

## 2023-09-15 MED ORDER — NITROGLYCERIN 0.4 MG SL SUBL: 0.4000 mg | SUBLINGUAL_TABLET | SUBLINGUAL | 10 refills | Status: DC | PRN

## 2023-09-15 NOTE — Telephone Encounter (Signed)
*  STAT* If patient is at the pharmacy, call can be transferred to refill team.   1. Which medications need to be refilled? (please list name of each medication and dose if known)   nitroGLYCERIN (NITROSTAT) 0.4 MG SL tablet     4. Which pharmacy/location (including street and city if local pharmacy) is medication to be sent to? [facility] [phone] - [place], [place] - [address] AT [address] OF [address]     5. Do they need a 30 day or 90 day supply? 90

## 2023-09-15 NOTE — Telephone Encounter (Signed)
Pt's medication was sent to pt's pharmacy as requested. Confirmation received.  °

## 2024-02-11 ENCOUNTER — Encounter: Payer: Self-pay | Admitting: Nurse Practitioner

## 2024-02-11 ENCOUNTER — Ambulatory Visit (INDEPENDENT_AMBULATORY_CARE_PROVIDER_SITE_OTHER): Payer: BC Managed Care – PPO | Admitting: Nurse Practitioner

## 2024-02-11 VITALS — BP 134/82 | HR 64 | Temp 97.6°F | Ht 66.0 in | Wt 241.1 lb

## 2024-02-11 DIAGNOSIS — E66812 Obesity, class 2: Secondary | ICD-10-CM | POA: Diagnosis not present

## 2024-02-11 DIAGNOSIS — Z0000 Encounter for general adult medical examination without abnormal findings: Secondary | ICD-10-CM

## 2024-02-11 DIAGNOSIS — Z6837 Body mass index (BMI) 37.0-37.9, adult: Secondary | ICD-10-CM | POA: Diagnosis not present

## 2024-02-11 DIAGNOSIS — R7303 Prediabetes: Secondary | ICD-10-CM

## 2024-02-11 DIAGNOSIS — Z0001 Encounter for general adult medical examination with abnormal findings: Secondary | ICD-10-CM | POA: Insufficient documentation

## 2024-02-11 DIAGNOSIS — Z1211 Encounter for screening for malignant neoplasm of colon: Secondary | ICD-10-CM

## 2024-02-11 LAB — CBC
HCT: 41.3 % (ref 36.0–46.0)
Hemoglobin: 13.9 g/dL (ref 12.0–15.0)
MCHC: 33.5 g/dL (ref 30.0–36.0)
MCV: 91.7 fl (ref 78.0–100.0)
Platelets: 231 10*3/uL (ref 150.0–400.0)
RBC: 4.51 Mil/uL (ref 3.87–5.11)
RDW: 14.1 % (ref 11.5–15.5)
WBC: 5.1 10*3/uL (ref 4.0–10.5)

## 2024-02-11 LAB — COMPREHENSIVE METABOLIC PANEL
ALT: 12 U/L (ref 0–35)
AST: 13 U/L (ref 0–37)
Albumin: 4.3 g/dL (ref 3.5–5.2)
Alkaline Phosphatase: 54 U/L (ref 39–117)
BUN: 13 mg/dL (ref 6–23)
CO2: 31 meq/L (ref 19–32)
Calcium: 9.3 mg/dL (ref 8.4–10.5)
Chloride: 103 meq/L (ref 96–112)
Creatinine, Ser: 0.76 mg/dL (ref 0.40–1.20)
GFR: 84.66 mL/min (ref >60.00)
Glucose, Bld: 113 mg/dL — ABNORMAL HIGH (ref 70–99)
Potassium: 4.5 meq/L (ref 3.5–5.1)
Sodium: 139 meq/L (ref 135–145)
Total Bilirubin: 0.5 mg/dL (ref 0.2–1.2)
Total Protein: 6.7 g/dL (ref 6.0–8.3)

## 2024-02-11 LAB — LIPID PANEL
Cholesterol: 173 mg/dL (ref 0–200)
HDL: 64.6 mg/dL (ref >39.00)
LDL Cholesterol: 96 mg/dL (ref 0–99)
NonHDL: 108.39
Total CHOL/HDL Ratio: 3
Triglycerides: 61 mg/dL (ref 0.0–149.0)
VLDL: 12.2 mg/dL (ref 0.0–40.0)

## 2024-02-11 LAB — URINALYSIS, ROUTINE W REFLEX MICROSCOPIC
Bilirubin Urine: NEGATIVE
Hgb urine dipstick: NEGATIVE
Ketones, ur: NEGATIVE
Leukocytes,Ua: NEGATIVE
Nitrite: NEGATIVE
RBC / HPF: NONE SEEN (ref 0–?)
Specific Gravity, Urine: <=1.005 — AB (ref 1.000–1.030)
Total Protein, Urine: NEGATIVE
Urine Glucose: NEGATIVE
Urobilinogen, UA: 0.2 (ref 0.0–1.0)
pH: 7 (ref 5.0–8.0)

## 2024-02-11 LAB — HEMOGLOBIN A1C: Hgb A1c MFr Bld: 6 % (ref 4.6–6.5)

## 2024-02-11 LAB — TSH: TSH: 0.83 u[IU]/mL (ref 0.35–5.50)

## 2024-02-11 NOTE — Assessment & Plan Note (Signed)
 Chronic Labs ordered, further recommendations may be made based upon his results.

## 2024-02-11 NOTE — Progress Notes (Signed)
 Complete physical exam  Patient: Kelly Good   DOB: 05/28/1961   63 y.o. Female  MRN: [id number]  Subjective:    Chief Complaint  Patient presents with   Annual Exam   Health Maintenance: Due for colonoscopy, history of hysterectomy follows up with OB/GYN, up-to-date with mammogram.  Declining vaccines to be administered today.  Has completed hep C screening, up-to-date on Tdap.   Most recent fall risk assessment:    02/11/2024    9:56 AM  Fall Risk   Falls in the past year? 0  Number falls in past yr: 0  Injury with Fall? 0  Risk for fall due to : No Fall Risks  Follow up Falls evaluation completed     Most recent depression screenings:    02/11/2024    9:56 AM 07/30/2023   10:46 AM  PHQ 2/9 Scores  PHQ - 2 Score 1 3  PHQ- 9 Score  6      Patient Active Problem List   Diagnosis Date Noted   Encounter for general adult medical examination with abnormal findings 02/11/2024   Varicose veins of both lower extremities with pain 07/30/2023   Headache 07/30/2023   Class 2 obesity with body mass index (BMI) of 37.0 to 37.9 in adult 01/23/2023   Colon cancer screening 06/26/2022   Encounter for lipid screening for cardiovascular disease 06/26/2022   Gastroesophageal reflux disease 06/26/2022   Prediabetes 05/22/2022   History of anaphylaxis 05/22/2022   Pelvic floor weakness in female 05/22/2022   Bilateral foot pain 05/22/2022   Metabolic syndrome 10/25/2021   Mild ascending aorta dilatation (HCC) 10/25/2021   PAT (paroxysmal atrial tachycardia) (HCC) 10/25/2020   Altered mental status, unspecified    BMI 38.0-38.9,adult    Diarrhea, unspecified    Endometriosis    Other chest pain    Palpitations    Vitamin D deficiency, unspecified    Asthma 08/29/2013   Endometrial cancer (HCC) 08/08/2013   History of endometriosis 06/18/2013   Past Medical History:  Diagnosis Date   Allergy 1968   Asthma   Altered mental status, unspecified    Anemia 1980   Low iron    Anxiety 2020   Asthma 1967/2006   Child asthma then adult onset asthma   BMI 38.0-38.9,adult    Cancer (HCC) 2014   Uterine   Depression 1992   Postpartum   Diabetes mellitus without complication (HCC) 1991   Gestational   Diarrhea, unspecified    Endometriosis    GERD (gastroesophageal reflux disease) 2019   Other chest pain    Palpitations    Sleep apnea 2008?   Snoring/ gadping   Sneezing    Vitamin D deficiency, unspecified    Past Surgical History:  Procedure Laterality Date   ABDOMINAL HYSTERECTOMY  2014   ABDOMINAL SURGERY     laporoscopic   CESAREAN SECTION     Social History   Socioeconomic History   Marital status: Married    Spouse name: Not on file   Number of children: Not on file   Years of education: Not on file   Highest education level: Bachelor's degree (e.g., BA, AB, BS)  Occupational History   Not on file  Tobacco Use   Smoking status: Never   Smokeless tobacco: Never  Substance and Sexual Activity   Alcohol use: Yes    Alcohol/week: 3.0 standard drinks of alcohol    Types: 2 Glasses of wine, 1 Cans of beer per week  Comment: Varies 0-5.   Drug use: No   Sexual activity: Yes    Birth control/protection: Surgical  Other Topics Concern   Not on file  Social History Narrative   Not on file   Social Drivers of Health   Financial Resource Strain: Low Risk  (02/10/2024)   Overall Financial Resource Strain (CARDIA)    Difficulty of Paying Living Expenses: Not very hard  Food Insecurity: No Food Insecurity (02/10/2024)   Hunger Vital Sign    Worried About Running Out of Food in the Last Year: Never true    Ran Out of Food in the Last Year: Never true  Transportation Needs: No Transportation Needs (02/10/2024)   PRAPARE - Administrator, Civil Service (Medical): No    Lack of Transportation (Non-Medical): No  Physical Activity: Insufficiently Active (02/10/2024)   Exercise Vital Sign    Days of Exercise per Week: 2 days     Minutes of Exercise per Session: 30 min  Stress: Stress Concern Present (02/10/2024)   [employer] of Occupational Health - Occupational Stress Questionnaire    Feeling of Stress : To some extent  Social Connections: Unknown (02/10/2024)   Social Connection and Isolation Panel [NHANES]    Frequency of Communication with Friends and Family: More than three times a week    Frequency of Social Gatherings with Friends and Family: Twice a week    Attends Religious Services: Patient declined    Database administrator or Organizations: Yes    Attends Engineer, structural: More than 4 times per year    Marital Status: Married  Catering manager Violence: Not At Risk (09/10/2023)   Received from [facility]   HITS    Over the last 12 months how often did your partner physically hurt you?: Never    Over the last 12 months how often did your partner insult you or talk down to you?: Never    Over the last 12 months how often did your partner threaten you with physical harm?: Never    Over the last 12 months how often did your partner scream or curse at you?: Never   Family History  Adopted: Yes   Allergies  Allergen Reactions   Codeine Shortness Of Breath and Other (See Comments)    Paradoxical response Hyper    Doxycycline Shortness Of Breath   Propranolol Shortness Of Breath    Wheezing, chest tightness, lethargy.   Pseudoephedrine Hcl Other (See Comments)    Paradoxical reaction Hyper/tingly    Latex Rash   Other Other (See Comments)    Peppers caused gastritis   Tomato Other (See Comments)    Gastritis   Amoxicillin Rash    Stomach ache    Casein Other (See Comments)   Food     Grains.   Gluten Meal Other (See Comments)    Upset stomach.   Lactose Intolerance (Gi) Other (See Comments)    Upset stomach.   Percocet [Oxycodone-Acetaminophen] Other (See Comments)    "made patient feel drunk"   Polysporin [Bacitracin-Polymyxin B] Rash   Sudafed [Pseudoephedrine  Hcl] Other (See Comments)    Hyper   Tilactase Other (See Comments)      Patient Care Team: Elenore Paddy, NP as PCP - General (Nurse Practitioner) Thomasene Ripple, DO as PCP - Cardiology (Cardiology)   Outpatient Medications Prior to Visit  Medication Sig   albuterol (VENTOLIN HFA) 108 (90 Base) MCG/ACT inhaler Inhale 2 puffs into the lungs  every 6 (six) hours as needed (for asthma).   bisoprolol (ZEBETA) 5 MG tablet Take 1 tablet (5 mg total) by mouth daily.   diphenhydrAMINE HCl (BENADRYL ALLERGY PO) Take by mouth as needed.   EPINEPHrine 0.3 mg/0.3 mL IJ SOAJ injection Inject 0.3 mg into the muscle as needed (for allergic reaction).   erythromycin ophthalmic ointment Place 1 Application into the left eye as needed.   IBUPROFEN PO Take 200 mg by mouth.   lactobacillus acidophilus (BACID) TABS tablet Take 2 tablets by mouth daily.   MAGNESIUM PO Take 1 tablet by mouth daily. Magnesium taurate   Melatonin 5 MG CAPS Take by mouth at bedtime.   Misc Natural Products (COLON HERBAL CLEANSER PO) Take 1 capsule by mouth as needed (for bowel movement).   Multiple Vitamin (MULTIVITAMIN WITH MINERALS) TABS tablet Take 1 tablet by mouth daily.   nitroGLYCERIN (NITROSTAT) 0.4 MG SL tablet Place 1 tablet (0.4 mg total) under the tongue every 5 (five) minutes as needed for chest pain.   OVER THE COUNTER MEDICATION Take 1 capsule by mouth daily. Catazyme.   No facility-administered medications prior to visit.    Review of Systems  Musculoskeletal:  Positive for back pain.  All other systems reviewed and are negative. :         Objective:     BP 134/82   Pulse 64   Temp 97.6 F (36.4 C) (Temporal)   Ht 5\' 6"  (1.676 m)   Wt 241 lb 2 oz (109.4 kg)   LMP 04/22/2013   SpO2 98%   BMI 38.92 kg/m  BP Readings from Last 3 Encounters:  02/11/24 134/82  08/10/23 116/70  07/30/23 130/84   Wt Readings from Last 3 Encounters:  02/11/24 241 lb 2 oz (109.4 kg)  08/10/23 235 lb 9.6 oz (106.9  kg)  07/30/23 240 lb 2 oz (108.9 kg)      Physical Exam Vitals reviewed.  Constitutional:      Appearance: Normal appearance.  HENT:     Head: Normocephalic and atraumatic.     Right Ear: Tympanic membrane, ear canal and external ear normal.     Left Ear: Tympanic membrane, ear canal and external ear normal.  Eyes:     General:        Right eye: No discharge.        Left eye: No discharge.     Extraocular Movements: Extraocular movements intact.     Conjunctiva/sclera: Conjunctivae normal.     Pupils: Pupils are equal, round, and reactive to light.  Neck:     Vascular: No carotid bruit.  Cardiovascular:     Rate and Rhythm: Normal rate and regular rhythm.     Pulses: Normal pulses.     Heart sounds: Normal heart sounds. No murmur heard. Pulmonary:     Effort: Pulmonary effort is normal.     Breath sounds: Normal breath sounds.  Chest:     Comments: Breast exam deferred per patient preference.  Abdominal:     General: Abdomen is flat. Bowel sounds are normal. There is no distension.     Palpations: Abdomen is soft. There is no mass.     Tenderness: There is no abdominal tenderness.  Musculoskeletal:        General: No tenderness.     Cervical back: Neck supple. No muscular tenderness.     Right lower leg: No edema.     Left lower leg: No edema.  Lymphadenopathy:  Cervical: No cervical adenopathy.     Upper Body:     Right upper body: No supraclavicular adenopathy.     Left upper body: No supraclavicular adenopathy.  Skin:    General: Skin is warm and dry.  Neurological:     General: No focal deficit present.     Mental Status: She is alert and oriented to person, place, and time.     Motor: No weakness.     Gait: Gait normal.  Psychiatric:        Mood and Affect: Mood normal.        Behavior: Behavior normal.        Judgment: Judgment normal.      No results found for any visits on 02/11/24.     Assessment & Plan:    Routine Health Maintenance and  Physical Exam  Immunization History  Administered Date(s) Administered   PFIZER(Purple Top)SARS-COV-2 Vaccination 03/13/2020, 04/03/2020, 10/25/2020   Tdap 01/23/2023    Health Maintenance  Topic Date Due   Pneumococcal Vaccine 93-69 Years old (1 of 2 - PCV) Never done   HIV Screening  Never done   Zoster Vaccines- Shingrix (1 of 2) Never done   Colonoscopy  Never done   MAMMOGRAM  Never done   INFLUENZA VACCINE  Never done   COVID-19 Vaccine (4 - 2024-25 season) 07/26/2023   Cervical Cancer Screening (HPV/Pap Cotest)  02/10/2025 (Originally 11/02/1992)   DTaP/Tdap/Td (2 - Td or Tdap) 01/22/2033   Hepatitis C Screening  Completed   HPV VACCINES  Aged Out    Discussed health benefits of physical activity, and encouraged her to engage in regular exercise appropriate for her age and condition.  Problem List Items Addressed This Visit       Other   Prediabetes   Chronic Labs ordered, further recommendations may be made based upon his results       Relevant Orders   CBC   Hemoglobin A1c   Comprehensive metabolic panel   TSH   Lipid panel   Urinalysis, Routine w reflex microscopic   Urine Culture   Colon cancer screening   Referral to GI ordered today      Relevant Orders   Ambulatory referral to Gastroenterology   Class 2 obesity with body mass index (BMI) of 37.0 to 37.9 in adult   Chronic Labs ordered, further recommendations may be made based upon his results       Relevant Orders   CBC   Hemoglobin A1c   Comprehensive metabolic panel   TSH   Lipid panel   Urinalysis, Routine w reflex microscopic   Urine Culture   Encounter for general adult medical examination with abnormal findings - Primary   Healthy lifestyle discussed, handout provided Referral to GI for colon cancer screening ordered today Screening labs ordered today Patient encouraged to continue following up with OB/GYN      Relevant Orders   CBC   Hemoglobin A1c   Comprehensive  metabolic panel   TSH   Lipid panel   Urinalysis, Routine w reflex microscopic   Urine Culture   Return in about 1 year (around 02/10/2025) for CPE with Maralyn Sago.     Elenore Paddy, NP

## 2024-02-11 NOTE — Assessment & Plan Note (Signed)
 Referral to GI ordered today

## 2024-02-11 NOTE — Assessment & Plan Note (Signed)
 Healthy lifestyle discussed, handout provided Referral to GI for colon cancer screening ordered today Screening labs ordered today Patient encouraged to continue following up with OB/GYN

## 2024-02-12 LAB — URINE CULTURE: Result:: NO GROWTH

## 2024-02-15 ENCOUNTER — Encounter: Payer: Self-pay | Admitting: Nurse Practitioner

## 2024-04-11 ENCOUNTER — Encounter: Payer: Self-pay | Admitting: Oncology

## 2024-05-04 ENCOUNTER — Other Ambulatory Visit: Payer: Self-pay | Admitting: *Deleted

## 2024-05-04 DIAGNOSIS — I10 Essential (primary) hypertension: Secondary | ICD-10-CM

## 2024-05-04 MED ORDER — BISOPROLOL FUMARATE 5 MG PO TABS: 5.0000 mg | ORAL_TABLET | Freq: Every day | ORAL | 1 refills | Status: DC

## 2024-08-05 ENCOUNTER — Encounter: Payer: Self-pay | Admitting: Cardiology

## 2024-11-04 ENCOUNTER — Other Ambulatory Visit: Payer: Self-pay | Admitting: Cardiology

## 2024-11-04 DIAGNOSIS — I10 Essential (primary) hypertension: Secondary | ICD-10-CM

## 2024-11-25 ENCOUNTER — Encounter: Payer: Self-pay | Admitting: Nurse Practitioner

## 2024-11-25 ENCOUNTER — Ambulatory Visit: Payer: Self-pay

## 2024-11-25 NOTE — Telephone Encounter (Signed)
 FYI Only or Action Required?: FYI only for provider: appointment scheduled on 11/28/2024.  Patient was last seen in primary care on 02/11/2024 by Kelly Lauraine BRAVO, NP.  Called Nurse Triage reporting Abdominal Pain.  Symptoms began several days ago.  Interventions attempted: Nothing.  Symptoms are: unchanged.  Triage Disposition: See Physician Within 24 Hours  Patient/caregiver understands and will follow disposition?: Yes  Copied from CRM #[id number]. Topic: Clinical - Red Word Triage >> Nov 25, 2024  1:32 PM Deaijah H wrote: Red Word that prompted transfer to Nurse Triage: Pain in abdominal area under ribs on right side that's coming and going. Reason for Disposition  [1] MODERATE pain (e.g., interferes with normal activities) AND [2] comes and goes (cramps) AND [3] present > 24 hours  (Exception: Pain with Vomiting or Diarrhea - see that Guideline.)  Answer Assessment - Initial Assessment Questions 1. LOCATION: Where does it hurt?      Right upper abdominal pain 2. RADIATION: Does the pain shoot anywhere else? (e.g., chest, back)     Denies radiation, but feels both anterior and posteriorly 3. ONSET: When did the pain begin? (e.g., minutes, hours or days ago)  Has occurred three times in 2.5 days 4. SUDDEN: Gradual or sudden onset?     sudden 5. PATTERN Does the pain come and go, or is it constant?     intermittent 6. SEVERITY: How bad is the pain?  (e.g., Scale 1-10; mild, moderate, or severe)     8/10 7. RECURRENT SYMPTOM: Have you ever had this type of stomach pain before? If Yes, ask: When was the last time? and What happened that time?      denies 8. AGGRAVATING FACTORS: Does anything seem to cause this pain? (e.g., foods, stress, alcohol)     unsure 9. CARDIAC SYMPTOMS: Do you have any of the following symptoms: chest pain, difficulty breathing, sweating, nausea?     denies  Protocols used: Abdominal Pain - Upper-A-AH

## 2024-11-27 ENCOUNTER — Emergency Department (HOSPITAL_BASED_OUTPATIENT_CLINIC_OR_DEPARTMENT_OTHER)
Admission: EM | Admit: 2024-11-27 | Discharge: 2024-11-27 | Disposition: A | Discharge status: Home / Self Care | Attending: Emergency Medicine | Admitting: Emergency Medicine

## 2024-11-27 ENCOUNTER — Encounter (HOSPITAL_BASED_OUTPATIENT_CLINIC_OR_DEPARTMENT_OTHER): Payer: Self-pay

## 2024-11-27 ENCOUNTER — Emergency Department (HOSPITAL_BASED_OUTPATIENT_CLINIC_OR_DEPARTMENT_OTHER)

## 2024-11-27 ENCOUNTER — Emergency Department (HOSPITAL_BASED_OUTPATIENT_CLINIC_OR_DEPARTMENT_OTHER): Admitting: Radiology

## 2024-11-27 ENCOUNTER — Other Ambulatory Visit: Payer: Self-pay

## 2024-11-27 DIAGNOSIS — R509 Fever, unspecified: Secondary | ICD-10-CM | POA: Diagnosis present

## 2024-11-27 DIAGNOSIS — R1011 Right upper quadrant pain: Secondary | ICD-10-CM | POA: Insufficient documentation

## 2024-11-27 DIAGNOSIS — Z91040 Latex allergy status: Secondary | ICD-10-CM | POA: Diagnosis not present

## 2024-11-27 DIAGNOSIS — J101 Influenza due to other identified influenza virus with other respiratory manifestations: Secondary | ICD-10-CM | POA: Diagnosis not present

## 2024-11-27 LAB — URINALYSIS, ROUTINE W REFLEX MICROSCOPIC
Bacteria, UA: NONE SEEN
Bilirubin Urine: NEGATIVE
Glucose, UA: NEGATIVE mg/dL
Hgb urine dipstick: NEGATIVE
Ketones, ur: 15 mg/dL — AB
Leukocytes,Ua: NEGATIVE
Nitrite: NEGATIVE
Protein, ur: 30 mg/dL — AB
Specific Gravity, Urine: 1.015 (ref 1.005–1.030)
pH: 7 (ref 5.0–8.0)

## 2024-11-27 LAB — BASIC METABOLIC PANEL WITH GFR
Anion gap: 10 (ref 5–15)
BUN: 9 mg/dL (ref 8–23)
CO2: 28 mmol/L (ref 22–32)
Calcium: 9.4 mg/dL (ref 8.9–10.3)
Chloride: 101 mmol/L (ref 98–111)
Creatinine, Ser: 0.78 mg/dL (ref 0.44–1.00)
GFR, Estimated: >60 mL/min
Glucose, Bld: 103 mg/dL — ABNORMAL HIGH (ref 70–99)
Potassium: 4.1 mmol/L (ref 3.5–5.1)
Sodium: 138 mmol/L (ref 135–145)

## 2024-11-27 LAB — RESP PANEL BY RT-PCR (RSV, FLU A&B, COVID)  RVPGX2
Influenza A by PCR: POSITIVE — AB
Influenza B by PCR: NEGATIVE
Resp Syncytial Virus by PCR: NEGATIVE
SARS Coronavirus 2 by RT PCR: NEGATIVE

## 2024-11-27 LAB — HEPATIC FUNCTION PANEL
ALT: 17 U/L (ref 0–44)
AST: 19 U/L (ref 15–41)
Albumin: 4.3 g/dL (ref 3.5–5.0)
Alkaline Phosphatase: 57 U/L (ref 38–126)
Bilirubin, Direct: 0.2 mg/dL (ref 0.0–0.2)
Indirect Bilirubin: 0.3 mg/dL (ref 0.3–0.9)
Total Bilirubin: 0.5 mg/dL (ref 0.0–1.2)
Total Protein: 6.9 g/dL (ref 6.5–8.1)

## 2024-11-27 LAB — CBC
HCT: 40.7 % (ref 36.0–46.0)
Hemoglobin: 13.7 g/dL (ref 12.0–15.0)
MCH: 30.9 pg (ref 26.0–34.0)
MCHC: 33.7 g/dL (ref 30.0–36.0)
MCV: 91.9 fL (ref 80.0–100.0)
Platelets: 167 K/uL (ref 150–400)
RBC: 4.43 MIL/uL (ref 3.87–5.11)
RDW: 14.3 % (ref 11.5–15.5)
WBC: 6.1 K/uL (ref 4.0–10.5)
nRBC: 0 % (ref 0.0–0.2)

## 2024-11-27 LAB — LIPASE, BLOOD: Lipase: 24 U/L (ref 11–51)

## 2024-11-27 MED ORDER — ACETAMINOPHEN 325 MG PO TABS: 650.0000 mg | ORAL_TABLET | Freq: Once | ORAL | Status: DC | PRN

## 2024-11-27 MED ORDER — ONDANSETRON 4 MG PO TBDP
4.0000 mg | ORAL_TABLET | Freq: Once | ORAL | Status: AC
  Administered 2024-11-27: 4 mg via ORAL
  Filled 2024-11-27: qty 1

## 2024-11-27 MED ORDER — ACETAMINOPHEN 500 MG PO TABS
1000.0000 mg | ORAL_TABLET | Freq: Once | ORAL | Status: AC
  Administered 2024-11-27: 1000 mg via ORAL
  Filled 2024-11-27: qty 2

## 2024-11-27 MED ORDER — ONDANSETRON HCL 4 MG PO TABS: 4.0000 mg | ORAL_TABLET | Freq: Four times a day (QID) | ORAL | 0 refills | Status: AC

## 2024-11-27 NOTE — ED Triage Notes (Addendum)
 Arrives ambulatory to the ED with complaints of worsening abdominal pain, shortness of breath, with new onset cough/fever x3-4 days. Patient has been taking tylenol /ibuprofen with minimal relief.

## 2024-11-27 NOTE — Discharge Instructions (Signed)
 Continue Tylenol  and ibuprofen for pain and fever.  Take Zofran  as needed for nausea.  Follow-up with your primary care doctor.  Return if symptoms worsen.

## 2024-11-27 NOTE — ED Provider Notes (Signed)
 " [place] EMERGENCY DEPARTMENT AT [facility] Provider Note   CSN: [id number] Arrival date & time: 11/27/24  1752     Patient presents with: Fever   Kelly Good is a 64 y.o. female.   Flulike symptoms since yesterday but some right upper abdominal pain the last few days before this.  Nothing makes worse or better.  She has had a little nausea but no vomiting.  No diarrhea.  She said body aches headache chills.  History of endometriosis anxiety.  The history is provided by the patient.       Prior to Admission medications  Medication Sig Start Date End Date Taking? Authorizing Provider  ondansetron  (ZOFRAN ) 4 MG tablet Take 1 tablet (4 mg total) by mouth every 6 (six) hours. 11/27/24  Yes Erich Kochan, DO  albuterol  (VENTOLIN  HFA) 108 (90 Base) MCG/ACT inhaler Inhale 2 puffs into the lungs every 6 (six) hours as needed (for asthma). 05/22/22   Elnor Lauraine BRAVO, NP  bisoprolol  (ZEBETA ) 5 MG tablet TAKE 1 TABLET(5 MG) BY MOUTH DAILY 11/04/24   Tobb, Kardie, DO  diphenhydrAMINE HCl (BENADRYL ALLERGY PO) Take by mouth as needed.    [provider]  EPINEPHrine  0.3 mg/0.3 mL IJ SOAJ injection Inject 0.3 mg into the muscle as needed (for allergic reaction). 05/22/22   Elnor Lauraine BRAVO, NP  erythromycin ophthalmic ointment Place 1 Application into the left eye as needed. 03/11/23   [provider]  IBUPROFEN PO Take 200 mg by mouth.    [provider]  lactobacillus acidophilus (BACID) TABS tablet Take 2 tablets by mouth daily.    [provider]  MAGNESIUM PO Take 1 tablet by mouth daily. Magnesium taurate    [provider]  Melatonin 5 MG CAPS Take by mouth at bedtime.    [provider]  Misc Natural Products (COLON HERBAL CLEANSER PO) Take 1 capsule by mouth as needed (for bowel movement).    [provider]  Multiple Vitamin (MULTIVITAMIN WITH MINERALS) TABS tablet Take 1 tablet by mouth daily.    [provider]  nitroGLYCERIN  (NITROSTAT ) 0.4 MG SL tablet Place 1 tablet (0.4 mg total) under the tongue every 5 (five) minutes as needed for chest pain. 09/15/23   Tobb, Kardie, DO  OVER THE COUNTER MEDICATION Take 1 capsule by mouth daily. Catazyme.    [provider]    Allergies: Codeine, Doxycycline, Propranolol , Pseudoephedrine hcl, Latex, Other, Tomato, Amoxicillin, Casein, Food, Gluten meal, Lactose intolerance (gi), Percocet [oxycodone-acetaminophen ], Polysporin [bacitracin-polymyxin b], Sudafed [pseudoephedrine hcl], and Tilactase    Review of Systems  Updated Vital Signs BP 122/60 (BP Location: Right Arm)   Pulse 72   Temp (!) 102 F (38.9 C)   Resp 18   Ht 5' 6 (1.676 m)   Wt 99.8 kg   LMP 04/22/2013   SpO2 94%   BMI 35.51 kg/m   Physical Exam Vitals and nursing note reviewed.  Constitutional:      General: She is not in acute distress.    Appearance: She is well-developed. She is not ill-appearing.  HENT:     Head: Normocephalic and atraumatic.     Nose: Nose normal.     Mouth/Throat:     Mouth: Mucous membranes are moist.  Eyes:     Conjunctiva/sclera: Conjunctivae normal.     Pupils: Pupils are equal, round, and reactive to light.  Cardiovascular:     Rate and Rhythm: Normal rate and regular rhythm.  Heart sounds: No murmur heard. Pulmonary:     Effort: Pulmonary effort is normal. No respiratory distress.     Breath sounds: Normal breath sounds.  Abdominal:     Palpations: Abdomen is soft.     Tenderness: There is abdominal tenderness.  Musculoskeletal:        General: No swelling.     Cervical back: Normal range of motion and neck supple.  Skin:    General: Skin is warm and dry.     Capillary Refill: Capillary refill takes less than 2 seconds.  Neurological:     General: No focal deficit present.     Mental Status: She is alert.  Psychiatric:        Mood and Affect: Mood normal.     (all labs ordered are listed, but only abnormal results are  displayed) Labs Reviewed  RESP PANEL BY RT-PCR (RSV, FLU A&B, COVID)  RVPGX2 - Abnormal; Notable for the following components:      Result Value   Influenza A by PCR POSITIVE (*)    All other components within normal limits  BASIC METABOLIC PANEL WITH GFR - Abnormal; Notable for the following components:   Glucose, Bld 103 (*)    All other components within normal limits  URINALYSIS, ROUTINE W REFLEX MICROSCOPIC - Abnormal; Notable for the following components:   Ketones, ur 15 (*)    Protein, ur 30 (*)    All other components within normal limits  CBC  HEPATIC FUNCTION PANEL  LIPASE, BLOOD    EKG: None  Radiology: US  Abdomen Limited RUQ (LIVER/GB) Result Date: 11/27/2024 EXAM: Right Upper Quadrant Abdominal Ultrasound 11/27/2024 08:02:00 PM TECHNIQUE: Real-time ultrasonography of the right upper quadrant of the abdomen was performed. COMPARISON: CT 05/06/2013. CLINICAL HISTORY: RUQ pain. FINDINGS: LIVER: Normal echogenicity. No intrahepatic biliary ductal dilatation. 2 cm hyperechoic area at the porta hepatis could reflect small hemangioma or focal fatty infiltration. Hepatopetal flow in the portal vein. BILIARY SYSTEM: Echogenic non-shadowing non-mobile focus along the posterior gallbladder wall compatible with 4 mm polyp. No pericholecystic fluid. No wall thickening or sonographic Murphy sign. No cholelithiasis. Common bile duct is within normal limits measuring 3 mm. OTHER: No right upper quadrant ascites. IMPRESSION: 1. No sonographic evidence of acute cholecystitis or biliary ductal dilatation. 2. 4 mm gallbladder wall polyp. Electronically signed by: Franky Crease MD 11/27/2024 08:07 PM EST RP Workstation: [id number]   DG Chest 2 View Result Date: 11/27/2024 EXAM: 2 VIEW(S) XRAY OF THE CHEST 11/27/2024 06:29:30 PM COMPARISON: Comparison with 05/06/2013. CLINICAL HISTORY: shortness of breath FINDINGS: LUNGS AND PLEURA: Shallow inspiration. Pulmonary vascularity is normal. Lungs are clear.  No pleural effusion. No pneumothorax. HEART AND MEDIASTINUM: Heart size is normal. Mediastinal contours appear intact. Tortuous aorta. BONES AND SOFT TISSUES: Degenerative changes in the spine. IMPRESSION: 1. No acute cardiopulmonary abnormality. Electronically signed by: Elsie Gravely MD 11/27/2024 06:53 PM EST RP Workstation: [id number]     Procedures   Medications Ordered in the ED  acetaminophen  (TYLENOL ) tablet 1,000 mg (1,000 mg Oral Given 11/27/24 1816)  ondansetron  (ZOFRAN -ODT) disintegrating tablet 4 mg (4 mg Oral Given 11/27/24 1946)                                    Medical Decision Making Amount and/or Complexity of Data Reviewed Labs: ordered. Radiology: ordered.  Risk OTC drugs. Prescription drug management.   Ashla Murph is here with flulike symptoms  and right upper quadrant abdominal pain.  Differential diagnosis likely flu versus less likely gallbladder infection or pancreatitis.  Will get CBC CMP lipase right upper quadrant ultrasound viral testing chest x-ray.  Will give Zofran .  Will give antipyretic.  She does have fever but no tachycardia.  Blood pressure is normal.  Patient positive for influenza.  Negative for pneumonia.  Electrolytes are unremarkable.  Labs with no significant leukocytosis.  Gallbladder liver enzymes normal.  Pancreas enzymes normal.  Overall I do suspect symptoms are secondary to the flu.  But will get ultrasound still to make sure there is no secondary gallstone/cholecystitis.  Ultrasound negative for stones.  She has a polyp but otherwise no acute findings on ultrasound.  Overall I think her symptoms are secondary to flu.  Will prescribe Zofran .  Declined Tamiflu.  Discharge.  Send return precautions.  This chart was dictated using voice recognition software.  Despite best efforts to proofread,  errors can occur which can change the documentation meaning.      Final diagnoses:  Influenza A    ED Discharge Orders          Ordered     ondansetron  (ZOFRAN ) 4 MG tablet  Every 6 hours        11/27/24 2014               Ruthe Cornet, DO 11/27/24 2015  "

## 2024-11-28 ENCOUNTER — Ambulatory Visit: Admitting: Family Medicine

## 2024-12-06 ENCOUNTER — Other Ambulatory Visit: Payer: Self-pay | Admitting: Cardiology

## 2024-12-06 DIAGNOSIS — I10 Essential (primary) hypertension: Secondary | ICD-10-CM

## 2024-12-30 ENCOUNTER — Other Ambulatory Visit: Payer: Self-pay | Admitting: Cardiology

## 2024-12-30 ENCOUNTER — Encounter: Payer: Self-pay | Admitting: Cardiology

## 2024-12-30 ENCOUNTER — Encounter: Payer: Self-pay | Admitting: Nurse Practitioner

## 2024-12-30 DIAGNOSIS — I10 Essential (primary) hypertension: Secondary | ICD-10-CM

## 2024-12-30 MED ORDER — NITROGLYCERIN 0.4 MG SL SUBL: 0.4000 mg | SUBLINGUAL_TABLET | SUBLINGUAL | 1 refills | Status: AC | PRN

## 2024-12-30 MED ORDER — BISOPROLOL FUMARATE 5 MG PO TABS: 5.0000 mg | ORAL_TABLET | Freq: Every day | ORAL | 0 refills | Status: AC

## 2025-02-16 ENCOUNTER — Encounter: Admitting: Nurse Practitioner

## 2025-02-21 ENCOUNTER — Ambulatory Visit: Admitting: Cardiology
